# Patient Record
Sex: Female | Born: 1960 | Race: White | Hispanic: No
Health system: Midwestern US, Academic
[De-identification: ages and names within clinical notes are randomized; demographics above are authoritative.]

---

## 2016-08-26 ENCOUNTER — Encounter: Admit: 2016-08-26 | Discharge: 2016-08-26 | Payer: BC Managed Care – HMO

## 2016-08-26 ENCOUNTER — Ambulatory Visit: Admit: 2016-08-26 | Discharge: 2016-08-26 | Payer: BC Managed Care – HMO

## 2016-08-26 DIAGNOSIS — I10 Essential (primary) hypertension: ICD-10-CM

## 2016-08-26 DIAGNOSIS — R079 Chest pain, unspecified: Principal | ICD-10-CM

## 2016-08-26 DIAGNOSIS — E785 Hyperlipidemia, unspecified: ICD-10-CM

## 2016-08-26 DIAGNOSIS — E119 Type 2 diabetes mellitus without complications: ICD-10-CM

## 2016-08-26 MED ORDER — PERFLUTREN LIPID MICROSPHERES 1.1 MG/ML IV SUSP
1-20 mL | Freq: Once | INTRAVENOUS | 0 refills | Status: CP
Start: 2016-08-26 — End: ?
  Administered 2016-08-26: 20:00:00 5.5 mL via INTRAVENOUS

## 2016-08-26 NOTE — Progress Notes
JID reviewed pre stress ECGs.

## 2016-11-04 ENCOUNTER — Encounter: Admit: 2016-11-04 | Discharge: 2016-11-04 | Payer: BC Managed Care – HMO

## 2016-11-04 NOTE — Telephone Encounter
Service: General Hepatology  Referring:  Gardiner BarefootLatitia Guthals, NP  Urgency: Next available  Provider: Next available  Dx: Hepatic steatosis  OV note: in outside records.  Imaging/location: No imaging available  Pathology/location: None  Labs: In outside records.

## 2016-11-04 NOTE — Telephone Encounter
Received new referral, via fax. Docs scanned in 11/04/16.

## 2016-11-09 ENCOUNTER — Encounter: Admit: 2016-11-09 | Discharge: 2016-11-09 | Payer: BC Managed Care – HMO

## 2016-11-09 NOTE — Telephone Encounter
Call from [facility] regarding her referral and an appointment.  Please return her call.

## 2016-12-14 ENCOUNTER — Encounter: Admit: 2016-12-14 | Discharge: 2016-12-14 | Payer: BC Managed Care – HMO

## 2016-12-15 ENCOUNTER — Encounter: Admit: 2016-12-15 | Discharge: 2016-12-15 | Payer: BC Managed Care – HMO

## 2016-12-15 ENCOUNTER — Ambulatory Visit: Admit: 2016-12-15 | Discharge: 2016-12-15 | Payer: BC Managed Care – HMO

## 2016-12-15 DIAGNOSIS — K7581 Nonalcoholic steatohepatitis (NASH): Principal | ICD-10-CM

## 2016-12-15 DIAGNOSIS — E119 Type 2 diabetes mellitus without complications: ICD-10-CM

## 2016-12-15 DIAGNOSIS — R079 Chest pain, unspecified: Principal | ICD-10-CM

## 2016-12-15 DIAGNOSIS — K760 Fatty (change of) liver, not elsewhere classified: ICD-10-CM

## 2016-12-15 DIAGNOSIS — E785 Hyperlipidemia, unspecified: ICD-10-CM

## 2016-12-15 DIAGNOSIS — I10 Essential (primary) hypertension: ICD-10-CM

## 2016-12-15 LAB — HEPATITIS A IGG: Lab: NEGATIVE

## 2016-12-15 LAB — CBC AND DIFF
Lab: 4.9 M/UL (ref 4.0–5.0)
Lab: 7.6 K/UL (ref 4.5–11.0)

## 2016-12-15 LAB — COMPREHENSIVE METABOLIC PANEL
Lab: 1 mg/dL (ref 0.4–1.00)
Lab: 134 MMOL/L — ABNORMAL LOW (ref 137–147)
Lab: 26 U/L (ref 7–56)
Lab: 273 mg/dL — ABNORMAL HIGH (ref 70–100)
Lab: 3.3 MMOL/L — ABNORMAL LOW (ref 3.5–5.1)
Lab: 57 mL/min — ABNORMAL LOW (ref >60)
Lab: 7.9 g/dL (ref 6.0–8.0)

## 2016-12-15 LAB — HEPATITIS B SURFACE AB

## 2016-12-15 LAB — HEPATITIS C ANTIBODY W REFLEX HCV PCR QUANT: Lab: NEGATIVE

## 2016-12-15 LAB — PROTIME INR (PT): Lab: 1 mg/dL (ref 0.8–1.2)

## 2016-12-15 LAB — HEPATITIS B SURFACE AG: Lab: NEGATIVE

## 2016-12-16 LAB — ANTI-MITOCHONDRIAL ANTIBODY: Lab: <20 {titer} — ABNORMAL HIGH (ref <20)

## 2016-12-16 LAB — ANTI-SMOOTH MUSCLE AB

## 2016-12-16 LAB — LIVER FIBROSIS, FIBRO TEST
Lab: 0.1 % (ref 4–12)
Lab: 0.2 MMOL/L — ABNORMAL LOW (ref 7–11)
Lab: 212

## 2016-12-16 LAB — ANTI-NUCLEAR ANTIBODY(ANA): Lab: <80 {titer} (ref <80)

## 2016-12-16 LAB — 25-OH VITAMIN D (D2 + D3): Lab: 27 ng/mL — ABNORMAL LOW (ref 30–80)

## 2016-12-16 LAB — ANTI-SMOOTH MUSCLE-QUANT: Lab: 40 {titer} — ABNORMAL HIGH (ref <20)

## 2016-12-17 LAB — ALPHA 1 ANTITRYPSIN PHENOTYPE: Lab: 140 mL/min (ref >60)

## 2016-12-17 LAB — CERULOPLASMIN: Lab: 34 mg/dL (ref 20.0–60.0)

## 2016-12-18 NOTE — Progress Notes
Date of Service: 12/15/2016    Gabrielle Andrade is a 56 y.o. female.  DOB: October 26, 1960  MRN: [id number]     Subjective:           Chief complaint: Hepatic steatosis    History of Present Illness   56 year old female with class I obesity (BMI 30 kg/m2), hypertension, diabetes, fibromyalgia, irritable bowel syndrome, and gastroparesis who presents for evaluation for hepatic steatosis.  Gabrielle Andrade states that in August she developed hematuria (single episode; none further) which she was told was secondary to kidney stones.  She underwent an evaluation for her hematuria including an abdomen CT on October 28, 2016 which showed extensive hepatic steatosis, minimal splenic granulomatous disease, mild colonic diverticulosis, and atelectasis.  She subsequently had an ultrasound on October 29, 2016 which showed hepatic steatosis, hepatomegaly (liver measuring 18 cm), normal size spleen, calcified granuloma within the spleen, and common bile duct 5.7 mm.  She denies knowledge of any prior history of liver disease.  She denies any history of abnormal liver tests.  She has never had a liver biopsy.  She denies any history of IV drug use, intranasal drug use, high risk sexual behaviors, blood transfusions prior to 1992, or tattoos.  She has a sister and a nephew who also have nonalcoholic fatty liver disease.  She denies heavy alcohol use.  She denies any history of ascites, jaundice, hepatic encephalopathy, day night reversal, LE edema, or GI bleeding.  She has had significant pruritus for an unknown period of time.    She had laboratory studies performed on October 26, 2016 white count 11.6 hemoglobin 15.8 platelets 282 sodium 136 potassium 3.5 glucose 260 total bilirubin 0.7 AST 22 ALT 32 alkaline phosphatase 141 triglycerides 193 total cholesterol 182 HDL 42 LDL 112 A1c 8.9.    She has a long-standing history of chronic diarrhea and abdominal pain.  She will have up to 7 bowel movements daily.  She denies any blood in the stool.  She has had dark stools twice a week for years.  She has undergone multiple endoscopic exams.    Colonoscopy July 2010: Small external hemorrhoids, random biopsies of colon normal.    EGD July 2010 LA grade A esophagitis, minimal gastric erythema, biopsies obtained for H. pylori, normal small intestine.  Biopsies of the stomach: benign gastric mucosa with mild chronic inflammation no H. Pylori. Esophageal bx: chronic esophagitis.    Colonoscopy April 18, 2014 mild diverticulosis in the sigmoid, biopsies obtained of the terminal ileum and the cecum.  Terminal ileum biopsies benign mucosa with mild chronic inflammation. Cecal biopsies benign colonic mucosa with mild chronic inflammation    Gastric imaging study December 2015 delayed gastric emptying of 141 minutes (normal 45 minutes)    Past Medical History  Diabetes mellitus  Hypertension  Gastroesophageal reflux disease  Fibromyalgia  Cholecystectomy  Hyperlipidemia  Irritable bowel syndrome  Chronic back pain  Sinus infections  Class I obesity    Social History  Divorced.  She has a 7 year old daughter and 22 year old son.  She drinks 1 alcoholic beverage every few months.  She denies any history of tobacco or illicit drug use.  She is retired from [employer].  She is currently working part-time at [employer].    Family History  Father with coronary artery disease, status post CABG.  Son with hypertension.     Review of Systems  Appetite change.  Fatigue.  Unexpected weight change.  Sinus pressure.  Sore throat.  Eye discharge.  Eye itching.  Sleep disturbance.  Chest tightness.  Shortness of breath.  Chest pain.  Joint pain.  Back pain.  Headaches, dizziness, lightheadedness for years.  A complete 10 point review of systems negative other as listed in HPI.    Objective:         ??? albuterol (VENTOLIN HFA) 90 mcg/actuation inhaler Inhale 2 puffs by mouth into the lungs every 6 hours as needed for Wheezing or Shortness of Breath. Shake well before use. ??? atorvastatin (LIPITOR) 40 mg tablet Take 40 mg by mouth daily.   ??? calcium carbonate/vitamin D-3 (OSCAL-500+D) 1250 mg/200 unit tablet Take 1 tablet by mouth daily. Calcium Carb 1250mg  delivers 500mg  elemental Ca   ??? cyanocobalamin (VITAMIN B-12) 500 mcg tablet Take 500 mcg by mouth daily.   ??? dicyclomine (BENTYL) 10 mg capsule Take 10 mg by mouth four times daily.   ??? fluticasone (FLONASE) 50 mcg/actuation nasal spray Apply  to each nostril as directed daily. Shake bottle gently before using.   ??? loratadine (CLARITIN) 10 mg tablet Take 10 mg by mouth every morning.   ??? losartan-hydrochlorothiazide (HYZAAR) 100-25 mg tablet Take 1 tablet by mouth every morning.   ??? magnesium oxide (MAG-OX) 400 mg (241.3 mg magnesium) tablet Take 5,000 mg by mouth daily.   ??? metFORMIN (GLUCOPHAGE) 500 mg tablet Take 1,000 mg by mouth daily with breakfast.   ??? metoclopramide (REGLAN) 5 mg tablet Take 5 mg by mouth four times daily.   ??? montelukast (SINGULAIR) 10 mg tablet Take 10 mg by mouth at bedtime daily.   ??? RABEprazole DR (+) (ACIPHEX) 20 mg tablet Take 20 mg by mouth daily.   ??? topiramate (TOPAMAX) 100 mg tablet Take 100 mg by mouth at bedtime daily.   ??? zaleplon(+) (SONATA) 10 mg capsule Take 10 mg by mouth at bedtime daily.     Vitals:    12/15/16 1414   BP: 127/67   Pulse: 87   Resp: 16   Temp: 36.8 ???C (98.2 ???F)   TempSrc: Oral   SpO2: 98%   Weight: 82 kg (180 lb 12.8 oz)   Height: 165.1 cm (65)     Body mass index is 30.09 kg/m???.     Physical Exam  Constitutional: Patient appears well-developed, well-nourished, and in no distress.??? Responds appropriately to questions.???  Neuro:??? No tremor noted.??? Patient is AOx4.??? No asterixis.  HEENT:??? Head is normocephalic and atraumatic.??? No scleral icterus noted.??? Dentition is intact.  Neck and Lymph: Normal ROM. Neck supple. No thyromegaly.??? No lymphadenopathy.??? No JVD  Cardiac:??? S1 and S2, regular rate and rhythm.??? No murmurs Respiratory:??? lung sounds clear to auscultation bilaterally.??? No wheezes or crackles.  GI:??? Abdomen soft, obese, non-distended, non-tender. BS present.   Skin:??? Skin is warm, dry, and intact. No rash noted. Nails show no clubbing. ???  Peripheral Vascular: No lower extremity edema.  Musculoskeletal:??? ROM intact.???  Psychiatric: Normal mood and affect. Behavior is normal. Judgment and thought content normal.       Assessment and Plan:  56 year old female with class I obesity (BMI 30 kg/m2), hypertension, diabetes, fibromyalgia, irritable bowel syndrome, and gastroparesis who presents for evaluation for hepatic steatosis.    1. Hepatic steatosis  2. Diabetes  3. Hypertension  4. Class I obesity, BMI 30 kg/m2.  5. Hepatomegaly.  Ms. Ciliberto has multiple metabolic risk factors making NASH likely the cause of her hepatic steatosis and hepatomegaly. However, this is a diagnosis of exclusion, so we will perform serologies to evaluate for  other causes of chronic liver disease. She has no clinical, radiographic, or laboratory evidence of cirrhosis, but we will check a fibrosure.     I suspect she has nonalcoholic fatty liver disease???(NAFLD), so we discussed this in some detail. ???We discussed how fat deposits in the liver, how this leads to inflammation, and how chronic inflammation (NASH) can ultimately lead to scarring and cirrhosis. ???The long-term complications of fatty liver disease were discussed with the patient, including the???increased???risk of cardiovascular disease complications, and???variable progression to cirrhosis and end stage liver disease. ??????Regarding the management of fatty liver disease, I did discuss with the patient about an appropriate diet, exercise and weight loss???plan. ???The patient should exercise regularly 4-5 times per week, with an average of about 30-45 minutes???per day. ???She should attempt to lose 10%???of body weight in the next six months. ???It has been???shown that patients who exercise regularly can have improvement of insulin resistance and resolution of fatty liver disease, even if they are not able to lose weight. ???Diabetes management is crucial with ideal goal Hgb A1c???goal of =/<???7%. She is on metformin. Management of elevated cholesterol is also important in this population. ???The use of statins (HMG-CoA reductase medications) are an effective means of therapy and are not contraindicated in those with abnormal liver tests OR those with cirrhosis. ???The value of these medications in this population far outweigh the minor risks of abnormal liver tests. ???Goal LDL in those with NASH are <???100 mg/dL. She is currently on Lipitor.  Her blood pressure should be optimized and it is at goal today on Hyzaar. ???We discussed considering adding 1-2 cups of black coffee per day.   ???  We will check her for evidence of immunity to hepatitis A and B. If she is not immune, she should be vaccinated.   ???  6. At risk for colon cancer. She had a colonoscopy in 2016 which was negative for polyps.    7. Gastroparesis.  8. IBS.  She should follow with her local GI.  She is on Bentyl and Acihex.     She will return to clinic in 1 year.

## 2016-12-21 ENCOUNTER — Encounter: Admit: 2016-12-21 | Discharge: 2016-12-21 | Payer: BC Managed Care – HMO

## 2016-12-21 LAB — HEMOCHROMATOSIS HFE GENE ANAL

## 2017-01-11 ENCOUNTER — Encounter: Admit: 2017-01-11 | Discharge: 2017-01-11 | Payer: BC Managed Care – HMO

## 2017-01-11 DIAGNOSIS — R748 Abnormal levels of other serum enzymes: Principal | ICD-10-CM

## 2017-01-11 MED ORDER — ERGOCALCIFEROL (VITAMIN D2) 50,000 UNIT PO CAP
1 | ORAL_CAPSULE | ORAL | 0 refills | 56.00000 days | Status: AC
Start: 2017-01-11 — End: 2018-05-05

## 2017-01-11 NOTE — Telephone Encounter
Called patient--went over lab results with her including fibrosure results. Told her Vit D is low so sending in an Rx for high dose therapy. Went over instructions for taking it and told her will mail her the lab order to repeat the level once she's done with the Rx. Advised Dr. Adela Lank recommends that she get vaccinated for Hep A and B as she's not immune. Told her that her blood sugar is elevated, so needs to work with PCP on better control. Also told her that her calcium was a little elevated, so Dr. Adela Lank would like to check a couple of additional labs. Patient to see her PCP on Wednesday and would like me to fax the orders and her results there. Will mail her the Vit D lab order.    Rx sent for Vit D.  Lab orders entered.  Mailed Vit D order to the patient's home and faxed lab orders and last results to patient's PCP, [facility] at [phone]. Confirmation rec'd.    Reminder set to f/u.

## 2017-01-11 NOTE — Telephone Encounter
-----   Message from Maryan Char, MD sent at 12/24/2016 10:23 PM CDT -----  Please review labs with patient. Hb only mildly elevated. Glucose is elevated. Needs to work with PCP to manage diabetes. Her calcium is slightly elevated. Please check calcium again and ionized calcium now.  If persistently elevated, will need to send additional workup including PTH, UPEP, SPEP, serum free light chains, 25-vitamin D, 1,25 vitamin D.  ASMA only mildly elevated and of unclear significance. Minimal fibrosis on fibrosure which is reassuring. Vitamin D low. Recommend Vitamin D 16109 IU weekly x 12 weeks. Recommend hepatitis A and B vaccination.

## 2017-04-08 ENCOUNTER — Encounter: Admit: 2017-04-08 | Discharge: 2017-04-08 | Payer: BC Managed Care – HMO

## 2017-04-08 MED ORDER — VITAMIN D2 50,000 UNIT PO CAP
ORAL_CAPSULE | 0 refills
Start: 2017-04-08 — End: ?

## 2017-04-23 ENCOUNTER — Encounter: Admit: 2017-04-23 | Discharge: 2017-04-23 | Payer: BC Managed Care – HMO

## 2017-04-28 ENCOUNTER — Encounter: Admit: 2017-04-28 | Discharge: 2017-04-28 | Payer: BC Managed Care – HMO

## 2017-10-25 ENCOUNTER — Encounter: Admit: 2017-10-25 | Discharge: 2017-10-25 | Payer: BC Managed Care – HMO

## 2018-02-07 ENCOUNTER — Encounter: Admit: 2018-02-07 | Discharge: 2018-02-07 | Payer: BC Managed Care – HMO

## 2018-04-06 ENCOUNTER — Encounter: Admit: 2018-04-06 | Discharge: 2018-04-06 | Payer: BC Managed Care – HMO

## 2018-04-06 DIAGNOSIS — E559 Vitamin D deficiency, unspecified: Secondary | ICD-10-CM

## 2018-04-06 DIAGNOSIS — K760 Fatty (change of) liver, not elsewhere classified: Secondary | ICD-10-CM

## 2018-04-06 DIAGNOSIS — R748 Abnormal levels of other serum enzymes: Secondary | ICD-10-CM

## 2018-04-07 ENCOUNTER — Encounter: Admit: 2018-04-07 | Discharge: 2018-04-07 | Payer: BC Managed Care – HMO

## 2018-04-14 ENCOUNTER — Encounter: Admit: 2018-04-14 | Discharge: 2018-04-14 | Payer: BC Managed Care – HMO

## 2018-04-19 ENCOUNTER — Encounter: Admit: 2018-04-19 | Discharge: 2018-04-19 | Payer: BC Managed Care – HMO

## 2018-04-21 ENCOUNTER — Encounter: Admit: 2018-04-21 | Discharge: 2018-04-21 | Payer: BC Managed Care – HMO

## 2018-04-21 DIAGNOSIS — R748 Abnormal levels of other serum enzymes: Secondary | ICD-10-CM

## 2018-04-21 DIAGNOSIS — K760 Fatty (change of) liver, not elsewhere classified: Secondary | ICD-10-CM

## 2018-04-21 LAB — COMPREHENSIVE METABOLIC PANEL
Lab: 121 U/L (ref 7–25)
Lab: 136 mmol/L
Lab: 16 meq/L — ABNORMAL HIGH (ref 0–14)
Lab: 25 mmol/L
Lab: 3.6 mmol/L
Lab: 99 mmol/L

## 2018-04-21 LAB — CBC AND DIFF
Lab: 16 K/UL — ABNORMAL HIGH (ref 4.5–11.0)
Lab: 27 U/L (ref 98–110)
Lab: 5.7 mg/dL — ABNORMAL HIGH (ref 4.20–5.40)
Lab: 7.6 mg/dL — ABNORMAL HIGH (ref 9.8–20.1)

## 2018-04-25 ENCOUNTER — Encounter: Admit: 2018-04-25 | Discharge: 2018-04-25 | Payer: BC Managed Care – HMO

## 2018-04-28 ENCOUNTER — Encounter: Admit: 2018-04-28 | Discharge: 2018-04-28 | Payer: BC Managed Care – HMO

## 2018-04-28 NOTE — Progress Notes
Faxed request for lab report to [facility] [phone]

## 2018-04-29 ENCOUNTER — Encounter: Admit: 2018-04-29 | Discharge: 2018-04-29 | Payer: BC Managed Care – HMO

## 2018-04-29 DIAGNOSIS — K760 Fatty (change of) liver, not elsewhere classified: Principal | ICD-10-CM

## 2018-05-02 ENCOUNTER — Encounter: Admit: 2018-05-02 | Discharge: 2018-05-02 | Payer: BC Managed Care – HMO

## 2018-05-02 DIAGNOSIS — E559 Vitamin D deficiency, unspecified: ICD-10-CM

## 2018-05-02 DIAGNOSIS — K760 Fatty (change of) liver, not elsewhere classified: Principal | ICD-10-CM

## 2018-05-02 LAB — CBC AND DIFF
Lab: 0.1 MMOL/L (ref 21–30)
Lab: 0.3 U/L (ref 7–40)
Lab: 0.5 U/L — ABNORMAL HIGH (ref 25–110)
Lab: 1.1 g/dL (ref 6.0–8.0)
Lab: 1.8 g/dL — ABNORMAL LOW (ref 3.5–5.0)
Lab: 28 mg/dL (ref 7–25)
Lab: 3.7 mg/dL — ABNORMAL LOW (ref 0.3–1.2)
Lab: 4.3 mg/dL — ABNORMAL HIGH (ref 8.5–10.6)
Lab: 4.9 mg/dL (ref >60)
Lab: 6.3 mg/dL — ABNORMAL HIGH (ref 70–105)

## 2018-05-02 LAB — COMPREHENSIVE METABOLIC PANEL
Lab: 0.9 mg/dL (ref 21–30)
Lab: 12 meq/L (ref 25–110)
Lab: 13 mg/dL (ref 7–40)
Lab: 141 mmol/L — ABNORMAL LOW (ref >40)
Lab: 27 mmol/L — ABNORMAL LOW (ref 3–12)
Lab: 3.4 mmol/L — ABNORMAL LOW (ref <100)
Lab: 65 mL/Min/1.73 (ref 7–56)

## 2018-05-02 LAB — 25-OH VITAMIN D (D2 + D3): Lab: 34 ng/mL (ref 30–100)

## 2018-05-04 ENCOUNTER — Encounter: Admit: 2018-05-04 | Discharge: 2018-05-04 | Payer: BC Managed Care – HMO

## 2018-05-05 ENCOUNTER — Ambulatory Visit: Admit: 2018-05-05 | Discharge: 2018-05-06 | Payer: BC Managed Care – HMO

## 2018-05-05 ENCOUNTER — Encounter: Admit: 2018-05-05 | Discharge: 2018-05-05 | Payer: BC Managed Care – HMO

## 2018-05-05 DIAGNOSIS — E119 Type 2 diabetes mellitus without complications: ICD-10-CM

## 2018-05-05 DIAGNOSIS — R079 Chest pain, unspecified: Principal | ICD-10-CM

## 2018-05-05 DIAGNOSIS — I10 Essential (primary) hypertension: ICD-10-CM

## 2018-05-05 DIAGNOSIS — E785 Hyperlipidemia, unspecified: ICD-10-CM

## 2018-05-06 DIAGNOSIS — K760 Fatty (change of) liver, not elsewhere classified: Principal | ICD-10-CM

## 2018-05-27 NOTE — Progress Notes
She will have up to 7 bowel movements daily.  She denies any blood in the stool.  She has had dark stools twice a week for years.  She has undergone multiple endoscopic exams.    Colonoscopy July 2010: Small external hemorrhoids, random biopsies of colon normal.    EGD July 2010 LA grade A esophagitis, minimal gastric erythema, biopsies obtained for H. pylori, normal small intestine.  Biopsies of the stomach: benign gastric mucosa with mild chronic inflammation no H. Pylori. Esophageal bx: chronic esophagitis.    Colonoscopy April 18, 2014 mild diverticulosis in the sigmoid, biopsies obtained of the terminal ileum and the cecum.  Terminal ileum biopsies benign mucosa with mild chronic inflammation. Cecal biopsies benign colonic mucosa with mild chronic inflammation    Gastric imaging study December 2015 delayed gastric emptying of 141 minutes (normal 45 minutes)    Interval History  She denies any hospitalizations or emergency room visits since she was last seen.  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  She continues to suffer from chronic back pain.  She feels that she is retaining fluid in her ankles.  She has found it hard to exercise due to her work schedule.  Her weight is up by 6 pounds.  She complains of chronic fatigue.  She denies any jaundice, ascites, hepatic encephalopathy, GI bleeding, or pruritus.    Past Medical History  Diabetes mellitus  Hypertension  Gastroesophageal reflux disease  Fibromyalgia  Cholecystectomy  Hyperlipidemia  Irritable bowel syndrome  Chronic back pain  Sinus infections  Class I obesity    Social History  Divorced.  She has a 58 year old daughter and 84 year old son.  She drinks 1 alcoholic beverage every few months.  She denies any history of tobacco or illicit drug use.  She is retired from [employer].  She is currently working part-time at [employer].    Family History  Father with coronary artery disease, status post CABG.  Son with ??????Regarding the management of fatty liver disease, I did discuss with the patient about an appropriate diet, exercise and weight loss???plan. ???The patient should exercise regularly 4-5 times per week, with an average of about 30-45 minutes???per day. ???She should attempt to lose 10%???of body weight in the next six months. ???It has been???shown that patients who exercise regularly can have improvement of insulin resistance and resolution of fatty liver disease, even if they are not able to lose weight. ???Diabetes management is crucial with ideal goal Hgb A1c???goal of =/<???7%. She is on Actos and glipizide.  Management of elevated cholesterol is also important in this population. ???The use of statins (HMG-CoA reductase medications) are an effective means of therapy and are not contraindicated in those with abnormal liver tests OR those with cirrhosis. ???The value of these medications in this population far outweigh the minor risks of abnormal liver tests. ???Goal LDL in those with NASH are <???100 mg/dL. She is currently on Zetia. Her blood pressure should be optimized and it is at goal today on Hyzaar. ???We discussed considering adding 1-2 cups of black coffee per day.   ???    6. At risk for colon cancer. She had a colonoscopy in 2016 which was negative for polyps.    7. Gastroparesis.  8. IBS.  She should follow with her local GI.  She is on Bentyl and Aciphex.     She will return to clinic in 1 year.

## 2019-05-11 ENCOUNTER — Encounter: Admit: 2019-05-11 | Discharge: 2019-05-11 | Payer: BC Managed Care – HMO

## 2019-05-15 ENCOUNTER — Encounter: Admit: 2019-05-15 | Discharge: 2019-05-15 | Payer: BC Managed Care – HMO

## 2019-05-15 NOTE — Telephone Encounter
Patient requested lab and [name] orders be faxed to [facility] at [phone]. Requested call once completed.

## 2019-05-15 NOTE — Telephone Encounter
Faxed the following orders to [facility]:    1. 2/19 lab orders   2. US/ABD order     Fax# [phone] (Per patient's request)

## 2019-05-18 ENCOUNTER — Encounter: Admit: 2019-05-18 | Discharge: 2019-05-18 | Payer: BC Managed Care – HMO

## 2019-05-18 DIAGNOSIS — K760 Fatty (change of) liver, not elsewhere classified: Secondary | ICD-10-CM

## 2019-05-26 ENCOUNTER — Encounter: Admit: 2019-05-26 | Discharge: 2019-05-26 | Payer: BC Managed Care – HMO

## 2019-05-29 ENCOUNTER — Encounter: Admit: 2019-05-29 | Discharge: 2019-05-29 | Payer: BC Managed Care – HMO

## 2019-05-29 DIAGNOSIS — E559 Vitamin D deficiency, unspecified: Secondary | ICD-10-CM

## 2019-05-29 DIAGNOSIS — K760 Fatty (change of) liver, not elsewhere classified: Secondary | ICD-10-CM

## 2019-05-29 DIAGNOSIS — E119 Type 2 diabetes mellitus without complications: Secondary | ICD-10-CM

## 2019-05-29 LAB — PROTIME INR (PT): Lab: 11 s — ABNORMAL HIGH (ref 4.20–5.40)

## 2019-05-29 LAB — HEPATITIS A TOTAL AB (IGG+IGM)

## 2019-05-29 LAB — COMPREHENSIVE METABOLIC PANEL
Lab: 0.6 mg/dL
Lab: 0.9 mg/dL
Lab: 10 mg/dL
Lab: 103 mmol/L
Lab: 116 U/L
Lab: 141 mmol/L — ABNORMAL HIGH (ref 37.0–47.0)
Lab: 16 meq/L — ABNORMAL HIGH (ref 0–14)
Lab: 17
Lab: 178 mg/dL — ABNORMAL HIGH (ref 70–105)
Lab: 25 U/L
Lab: 26 mmol/L
Lab: 31 U/L
Lab: 4.7 g/dL
Lab: 65
Lab: 7.8 g/dL

## 2019-05-29 LAB — HEMOGLOBIN A1C: Lab: 7.7 — ABNORMAL HIGH (ref <5.7)

## 2019-05-29 LAB — 25-OH VITAMIN D (D2 + D3): Lab: 35 mmol/L

## 2019-05-29 LAB — CBC AND DIFF: Lab: 7

## 2019-05-29 NOTE — Progress Notes
Fax Request sent to [facility] for patient labs.   FAX: [phone]

## 2019-05-31 ENCOUNTER — Encounter: Admit: 2019-05-31 | Discharge: 2019-05-31 | Payer: BC Managed Care – HMO

## 2019-05-31 NOTE — Telephone Encounter
Spoke to patient. Has MyChart and Zoom downloaded. Patient understands to test Zoom video and audio on chosen device. Pt has pop up blockers disabled on their device. Pt. was able to log into MyChart to verify they have access. Patient instructed to read the Telehealth consents in MyChart.  Notified patient that the clinic staff will call 30min-1hr prior to visit. Pt. Reports that they are able to get vitals prior to the visit.  Pt. had no questions at this time.

## 2019-06-01 ENCOUNTER — Encounter: Admit: 2019-06-01 | Discharge: 2019-06-01 | Payer: BC Managed Care – HMO

## 2019-06-01 ENCOUNTER — Ambulatory Visit: Admit: 2019-06-01 | Discharge: 2019-06-02 | Payer: BC Managed Care – HMO

## 2019-06-30 ENCOUNTER — Encounter: Admit: 2019-06-30 | Discharge: 2019-06-30 | Payer: BC Managed Care – HMO

## 2019-07-27 ENCOUNTER — Encounter: Admit: 2019-07-27 | Discharge: 2019-07-27 | Payer: BC Managed Care – HMO

## 2019-07-27 NOTE — Telephone Encounter
Requested call regarding getting lab results.

## 2019-07-31 ENCOUNTER — Encounter: Admit: 2019-07-31 | Discharge: 2019-07-31 | Payer: BC Managed Care – HMO

## 2019-08-11 ENCOUNTER — Encounter: Admit: 2019-08-11 | Discharge: 2019-08-11 | Payer: BC Managed Care – HMO

## 2019-08-16 ENCOUNTER — Encounter: Admit: 2019-08-16 | Discharge: 2019-08-16 | Payer: BC Managed Care – HMO

## 2019-08-22 ENCOUNTER — Encounter: Admit: 2019-08-22 | Discharge: 2019-08-22 | Payer: BC Managed Care – HMO

## 2019-09-11 ENCOUNTER — Encounter: Admit: 2019-09-11 | Discharge: 2019-09-11 | Payer: BC Managed Care – HMO

## 2019-09-11 DIAGNOSIS — K760 Fatty (change of) liver, not elsewhere classified: Secondary | ICD-10-CM

## 2019-09-11 DIAGNOSIS — R600 Localized edema: Secondary | ICD-10-CM

## 2019-09-13 ENCOUNTER — Encounter: Admit: 2019-09-13 | Discharge: 2019-09-13 | Payer: BC Managed Care – HMO

## 2019-09-14 ENCOUNTER — Encounter: Admit: 2019-09-14 | Discharge: 2019-09-14 | Payer: BC Managed Care – HMO

## 2019-09-14 DIAGNOSIS — K760 Fatty (change of) liver, not elsewhere classified: Secondary | ICD-10-CM

## 2019-09-14 LAB — HEPATITIS B SURFACE AB

## 2019-11-17 ENCOUNTER — Encounter: Admit: 2019-11-17 | Discharge: 2019-11-17 | Payer: BC Managed Care – HMO

## 2020-02-05 ENCOUNTER — Encounter: Admit: 2020-02-05 | Discharge: 2020-02-05 | Payer: BC Managed Care – HMO

## 2020-02-05 NOTE — Telephone Encounter
Gabrielle Andrade returned a missed call.  Requested a call back regarding appointment.

## 2020-02-05 NOTE — Telephone Encounter
Scheduled from Cherie (06/18/20 11:30) to JW 06/18/20 2:30PM

## 2020-02-12 ENCOUNTER — Encounter: Admit: 2020-02-12 | Discharge: 2020-02-12 | Payer: BC Managed Care – HMO

## 2020-05-31 ENCOUNTER — Encounter: Admit: 2020-05-31 | Discharge: 2020-05-31 | Payer: BC Managed Care – HMO

## 2020-05-31 NOTE — Telephone Encounter
RN returned call to patient. Lab orders faxed to provided fax number. Patient was notified.

## 2020-05-31 NOTE — Telephone Encounter
Pt called to remind NC that she is needing labs orders to be faxed to [facility] at F: [phone] so that she will labs results ready for her appt on 03/28 at 400pm. Please give her a call once lab orders have been faxed.

## 2020-06-10 ENCOUNTER — Encounter: Admit: 2020-06-10 | Discharge: 2020-06-10 | Payer: BC Managed Care – HMO

## 2020-06-10 DIAGNOSIS — K760 Fatty (change of) liver, not elsewhere classified: Secondary | ICD-10-CM

## 2020-06-10 DIAGNOSIS — R748 Abnormal levels of other serum enzymes: Secondary | ICD-10-CM

## 2020-06-10 LAB — LIPID PROFILE
Lab: 155 — ABNORMAL HIGH (ref <150)
Lab: 31 — ABNORMAL HIGH (ref 0–14)
Lab: 44 — ABNORMAL HIGH (ref 9.8–20.1)
Lab: 5 mg/dL

## 2020-06-10 LAB — COMPREHENSIVE METABOLIC PANEL
Lab: 0.4 mg/dL
Lab: 118
Lab: 137 mmol/l — ABNORMAL HIGH (ref 4.20–5.40)
Lab: 19
Lab: 231 — ABNORMAL HIGH (ref 70–105)
Lab: 33
Lab: 4.1
Lab: 54 — ABNORMAL LOW (ref >59)
Lab: 7.3
Lab: 9.9

## 2020-06-10 LAB — CBC AND DIFF
Lab: 0.1
Lab: 6

## 2020-06-10 LAB — PROTIME INR (PT)
Lab: 1 — ABNORMAL HIGH (ref <100)
Lab: 10 s — ABNORMAL HIGH (ref <200)

## 2020-06-13 ENCOUNTER — Encounter: Admit: 2020-06-13 | Discharge: 2020-06-13 | Payer: BC Managed Care – HMO

## 2020-06-13 DIAGNOSIS — K760 Fatty (change of) liver, not elsewhere classified: Secondary | ICD-10-CM

## 2020-06-13 DIAGNOSIS — R748 Abnormal levels of other serum enzymes: Secondary | ICD-10-CM

## 2020-06-13 LAB — 25-OH VITAMIN D (D2 + D3): Lab: 34 ng/mL (ref 30–100)

## 2020-06-17 ENCOUNTER — Encounter: Admit: 2020-06-17 | Discharge: 2020-06-17 | Payer: BC Managed Care – HMO

## 2020-06-17 ENCOUNTER — Ambulatory Visit: Admit: 2020-06-17 | Discharge: 2020-06-17 | Payer: BC Managed Care – HMO

## 2020-06-17 DIAGNOSIS — R079 Chest pain, unspecified: Secondary | ICD-10-CM

## 2020-06-17 DIAGNOSIS — I10 Essential (primary) hypertension: Secondary | ICD-10-CM

## 2020-06-17 DIAGNOSIS — E7849 Other hyperlipidemia: Secondary | ICD-10-CM

## 2020-06-17 DIAGNOSIS — K7581 Nonalcoholic steatohepatitis (NASH): Secondary | ICD-10-CM

## 2020-06-17 DIAGNOSIS — E559 Vitamin D deficiency, unspecified: Secondary | ICD-10-CM

## 2020-06-17 DIAGNOSIS — E119 Type 2 diabetes mellitus without complications: Secondary | ICD-10-CM

## 2020-06-17 DIAGNOSIS — R7302 Impaired glucose tolerance (oral): Secondary | ICD-10-CM

## 2020-06-17 DIAGNOSIS — E785 Hyperlipidemia, unspecified: Secondary | ICD-10-CM

## 2020-06-17 DIAGNOSIS — Z713 Dietary counseling and surveillance: Secondary | ICD-10-CM

## 2020-06-17 NOTE — Progress Notes
Date of Service: 06/17/2020     Chief complaint/Reason for visit:  Routine hepatology visit   Native Liver Disease:   Primary Hepatologist: Dr. Jan Fireman (previously Dr. Adela Lank)  Last clinic visit: 06/01/2019 with Dr. Adela Lank  PCP: Rockwell [place], [facility]    MyChart for communication: Yes    Subjective:          Gabrielle Andrade is a 60 y.o. 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  ?  History of Present Illness  Gabrielle Andrade is a 60 y.o. female presenting to the hepatology clinic alone today for continued management of metabolic dysfunction associated fatty liver disease.    Please refer to additional hepatology documentation noted on 06/01/2019 for additional pertinent history    We discussed how fat deposits in the liver, how this leads to inflammation, and how chronic inflammation can ultimately lead to scarring and cirrhosis. The long-term complications of fatty liver disease were discussed with the patient, including variable progression to cirrhosis and end stage liver disease. We extensively reviewed the progression of liver fibrosis from F0/F1 to F4 (cirrhosis).       Discussed and reviewed potential plan(s) of care: lifestyle modifications, use of pharmacotherapy (GLP-1 agonist, SGLT2 inhibitor), and metabolic risk factor control.      We reviewed the role of uncontrolled metabolic risk factors and the negative impact of these disease process with a concurrent diagnosis of NAFLD (i.e. hypertension, hyperlipidemia, diabetes, and untreated obstructive sleep apnea).     We additionally discussed the implementation of lifestyle modifications including Mediterranean style diet that is moderately low in carbohydrates and consists of lean proteins (chicken, fish, [place]).     We reviewed the recommendation to reduce the amounts of red meat consumption as it has shown higher rates of organ fat deposition.  Mediterranean dietary handout provided today in patient AVS.     She denies any overt signs of GI bleeding at this time such as hematemesis, coffee-ground emesis, melena, or hematochezia.    She does endorse that she has had some difficulties in controlling her fasting blood sugars.  She reports she generally runs from the 1 60-2 60 range.  Discussed today potential referral for endocrinology with her reported most recent A1c of 8.4.  She updates that she is seeing her primary care provider in the coming weeks and will inquire about a referral.  Advised her to additionally follow-up with our office should she need an additional assistance with a referral.    Noted she is hypertensive today presenting greater than 160 systolic.  Recheck noted to be 148/84.  She reports that previously in October her blood pressure was well controlled at 110/52.  Advised that she continue to follow closely with her primary care provider for ongoing management.    Tobacco Use: None  Alcohol Use: rare, once-twice per year  Substance Use: None    Hospitalizations since last OV: COVID + pneumonia (Aug 2021)    Past Medical History:  Medical History:   Diagnosis Date   ? Chest pain    ? Diabetes (HCC)    ? HTN (hypertension)    ? Hyperlipemia      No past surgical history on file.    Social History     Socioeconomic History   ? Marital status: Divorced     Spouse name: Not on file   ?  Number of children: Not on file   ? Years of education: Not on file   ? Highest education level: Not on file   Occupational History   ? Not on file   Tobacco Use   ? Smoking status: Never Smoker   ? Smokeless tobacco: Never Used   Substance and Sexual Activity   ? Alcohol use: Not on file   ? Drug use: Not on file   ? Sexual activity: Not on file   Other Topics Concern   ? Not on file   Social History Narrative   ? Not on file     No family history on file.    Review of Systems  A complete 10 point review of systems was negative except those listed in the HPI.    Reviewed and updated active medication and allergy list.     Objective:         ? albuterol (VENTOLIN HFA) 90 mcg/actuation inhaler Inhale 2 puffs by mouth into the lungs every 6 hours as needed for Wheezing or Shortness of Breath. Shake well before use.   ? ascorbic acid (VITAMIN C PO) Take 1,000 mg by mouth four times weekly. emergency vitamin C drink   ? calcium carbonate/vitamin D-3 (OSCAL-500+D) 1250 mg/200 unit tablet Take 1 tablet by mouth daily. Calcium Carb 1250mg  delivers 500mg  elemental Ca   ? canagliflozin (INVOKANA) 300 mg tablet Take 300 mg by mouth daily with breakfast.   ? Cinnamon Bark (CINNAMON) 500 mg cap Take  by mouth.   ? CoQ10 (Ubiquinol) 200 mg cap Take 200 mg by mouth four times weekly.   ? ergocalciferol (vitamin D2) (VITAMIN D PO) Take  by mouth.   ? ezetimibe (ZETIA) 10 mg tablet Take 10 mg by mouth daily.   ? famotidine (PEPCID) 20 mg tablet Take 20 mg by mouth daily.   ? Lactobac. rhamnosus GG-inulin (CULTURELLE DIGESTIVE HEALTH) 10 billion cell -200 mg cap Take  by mouth.   ? levocetirizine 5 mg tab Take 5 mg by mouth at bedtime daily.   ? losartan-hydrochlorothiazide (HYZAAR) 100-25 mg tablet Take 1 tablet by mouth every morning.   ? magnesium oxide (MAG-OX) 400 mg (241.3 mg magnesium) tablet Take 500 mg by mouth daily.   ? montelukast (SINGULAIR) 10 mg tablet Take 10 mg by mouth at bedtime daily.   ? pantoprazole DR (PROTONIX) 40 mg tablet Take 40 mg by mouth daily.   ? potassium chloride SR (K-DUR) 10 mEq tablet Take 10 mEq by mouth daily. Take with a meal and a full glass of water.   ? topiramate (TOPAMAX) 100 mg tablet Take 100 mg by mouth at bedtime daily.   ? vitamin E acetate (VITAMIN E PO) Take 180 mg by mouth four times weekly.     Vitals:    06/17/20 1606 06/17/20 1656   BP: (!) 162/76 (!) 148/84   BP Source: Arm, Right Upper    Patient Position: Sitting    Pulse: 89 75   SpO2: 99%    Weight: 80.3 kg (177 lb)    Height: 165.1 cm (5' 5)    PainSc: Zero      Body mass index is 29.45 kg/m?Marland Kitchen      Physical Exam  Vitals and nursing note reviewed.   Constitutional:       Appearance: Normal appearance.   HENT:      Head: Normocephalic and atraumatic.      Mouth/Throat:      Comments: Mask  in place  Eyes:      General: No scleral icterus.     Conjunctiva/sclera: Conjunctivae normal.   Cardiovascular:      Rate and Rhythm: Normal rate and regular rhythm.      Pulses: Normal pulses.      Heart sounds: Normal heart sounds.   Pulmonary:      Effort: Pulmonary effort is normal.      Breath sounds: Normal breath sounds.   Abdominal:      General: Bowel sounds are normal. There is no distension.      Palpations: Abdomen is soft.      Tenderness: There is no abdominal tenderness. There is no guarding or rebound.   Musculoskeletal:         General: No swelling.      Cervical back: Normal range of motion and neck supple.      Right lower leg: No edema.      Left lower leg: No edema.   Skin:     General: Skin is warm and dry.      Coloration: Skin is not jaundiced.   Neurological:      General: No focal deficit present.      Mental Status: She is alert and oriented to person, place, and time. Mental status is at baseline.   Psychiatric:         Mood and Affect: Mood normal.         Behavior: Behavior normal.         Thought Content: Thought content normal.         Judgment: Judgment normal.       MELD-Na score: 7 at 06/10/2020  7:52 AM  MELD score: 7 at 06/10/2020  7:52 AM  Calculated from:  Serum Creatinine: 1.10 mg/dL at 1/61/0960  4:54 AM  Serum Sodium: 137 mmol/l at 06/10/2020  7:52 AM  Total Bilirubin: 0.49 mg/dL (Using min of 1 mg/dL) at 0/98/1191  4:78 AM  INR(ratio): 1.0 at 06/10/2020  7:52 AM  Age: 9 years    Assessment/Plan/Recommendations    Metabolic Dysfunction Associated Fatty Liver Disease/NASH:   - Regarding the management of fatty liver disease, discussed an appropriate diet, exercise and weight loss plan.  The patient should exercise regularly 3-4 times per week, with an average of 30-45 minutes per day. The patient should be on a moderately low carbohydrate (i.e. Mediterranean style diet), low-calorie diet (1500-1700 calories per day).   - It is recommended to lose 5% body weight in order to reduce steatosis; and 10% is needed to reduce inflammation associated with NASH. Weight loss may be achieved through diet alone, or ideally, in conjunction with exercise.   Wt Readings from Last 6 Encounters:   06/17/20 80.3 kg (177 lb)   05/05/18 84.6 kg (186 lb 6.4 oz)   12/15/16 82 kg (180 lb 12.8 oz)   08/26/16 82.1 kg (181 lb)   08/26/16 81.6 kg (180 lb)     Body mass index is 29.45 kg/m?.   -  It has shown that patients who exercise regularly can have improvement of insulin resistance and resolution of fatty liver disease, even if they are not able to lose weight. Discussed use of GLP-1 agonist and SGLT2 inhibitor for additional management of fatty liver disease. Discussed role of GLP-1 agonist which will help with glucose control, weight loss and has also been shown to help reverse fibrotic changes in the liver.  - Consider the utility of liberalizing coffee and black  tea consumption (1-2 cups daily) as some data supports this may slow progression and reverse effects of NASH-related fibrosis. Additional reduction in red meat consumption as it has shown to have higher rates of organ fat deposition was discussed.  - The highest mortality in NAFLD/NASH is from cardiovascular disease and careful attention should be paid to those risk factors, including management of HTN, HLD, DM2.     Lipid Management/Screening:     - Management of elevated cholesterol is also important in this population.  The use of statins (HMG-CoA reductase medications) are an effective means of therapy and are not contra-indicated in those with abnormal liver tests OR those with cirrhosis.  The value of these medications in this population far outweigh the minor risks of abnormal liver tests.     - Transient asymptomatic liver enzyme elevation up to 3 times upper normal limit is common with a statin treatment.   - Goal LDL in those with NALFD/NASH are < 100 mg/dL or 70mg /dL in individuals with known diabetes.   -Follows with her primary care provider for ongoing management.    Dietary Counseling:   - Discussed above noted recommendations and encouraged continued lifestyle modifications.     Obesity (BMI 30-39.9):   - Class I (BMI ?30 to 35), class II (BMI ?35 to 40), and class III (BMI ?40).     Impaired Glucose Tolerance OR Diabetes Management/Screening:    - Reviewed A1C values: Normal below 5.7%, prediabetes 5.7-6.4%, >6.5% correlates to a diagnosis of diabetes.   - Diabetes management is crucial with ideal goal Hgb A1c goal of =/< 7%.    - Last Hemoglobin A1C: 8.4 (reported value from recent outside labs)    Bone health:   - Strongly recommend screening for Vitamin D deficiency at least twice yearly with aggressive supplementation/replacement as indicated. Replacement per CFT protocol for values less than 30ng/mL.  Lab Results   Component Value Date/Time    VITD25 34 06/10/2020 07:52 AM   - All patients with liver disease, particularly those with cholestatic liver disease, are at an increased risk for osteopenia/osteoporosis.   - Recommend calcium +Vitamin D supplementation (2000 IU of Vitamin D and 1200mg  Calcium)..      Vaccinations:  Immunization History   Administered Date(s) Administered   ? COVID-19 (MODERNA), mRNA vacc, 100 mcg/0.5 mL (PF) 02/23/2020, 03/29/2020    Hepatitis A/B (Strongly recommend vaccinations for hepatitis A and B if the patient not immune.)  -Reviewed titers.  Noted to have dual immunity.    Health Maintenance & Cancer Screening:   - Recommend you stay up to date for cancer screening: mammograms/PAP for women and prostate cancer screening for men, and colon cancer screening for all.  Health Maintenance   Topic Date Due   ? HIV SCREENING  Never done   ? DTAP/TDAP VACCINES (1 - Tdap) Never done   ? PHYSICAL (COMPREHENSIVE) EXAM  Never done   ? CERVICAL CANCER SCREENING  Never done   ? BREAST CANCER SCREENING  Never done   ? SHINGLES RECOMBINANT VACCINE (1 of 2) Never done   ? COLORECTAL CANCER SCREENING  Never done   ? INFLUENZA VACCINE  Never done   ? COVID-19 VACCINE (3 - Booster for Moderna series) 08/27/2020   ? HEPATITIS C SCREENING  Completed      Routine Health Care in Patient with Chronic Liver Disease:  - All patients with liver disease should avoid the use of Non-steroidal Anti-Inflammatory (NSAID) medications as they can cause  significant injury to the kidneys in this population.  - Recommended the patient avoid herbal supplements and over the counter herbal remedies due to potential hepatic toxicities.      COVID 19 Precautions:   - With underlying liver disease you are categorized as high risk. --> We encourage that when available, you proceed with COVID 19 vaccination.    - If you are diagnosed with COVID 19, please notify our office immediately to determine if you are a possible candidate to receive mAB infusion therapy.     For up to date information on the COVID-19 virus, visit the [facility] website. [email or web address]  ? General supportive care during cold and flu season and infection prevention reminders:   ? Wash hands often with soap and water for at least 20 seconds  ? Cover your mouth and nose  ? Social distancing: try to maintain 6 feet between you and other people  ? Stay home if sick and symptoms mild or manageable  ? If you must be around people wear a mask     ? If you are having symptoms of a lower respiratory infection (cough, shortness of breath) and/or fever AND either traveled in last 30 days (internationally or to region of exposure) OR known exposure to patient with COVID19:     ? Call your primary care provider for questions or health needs.   ? Tell your doctor about your recent travel and your symptoms     ? In a medical emergency, call 911 or go to the nearest emergency room.    Follow Up:   12 months in clinic or sooner if needed. The patient is to call our office with any questions  or changes to your health condition.     Labs: MELD (CBC +Diff, CMP, INR), Vit D, Lipid Panel     Imaging: N/A      Procedures/Biopsy: N/A     Referrals: N/A    Other: N/A      A total of 45 minutes was spent in the following activities: Preparing to see the patient, Obtaining and/or reviewing separately obtained history, Performing a medically appropriate examination and/or evaluation, Counseling and educating the patient/family/caregiver, Ordering medications, tests, or procedures, Documenting clinical information in the electronic or other health record and Independently interpreting results (not separately reported) and communicating results to the patient/family/caregiver.    Patient/patient family reports that all questions have been answered at this time.  No additional pending concerns.    Thank you very much for the opportunity to participate in the care of this patient.  If you have any further questions, please don't hesitate to contact our office. This note was created using Dragon, a Chemical engineer.  Please contact my office for any clarification of documentation.    _____________________________  Marcelyn Bruins, APRN-C  Hepatology & Liver Transplantation  [phone]  [phone]  Fax: [phone]

## 2020-07-03 ENCOUNTER — Encounter: Admit: 2020-07-03 | Discharge: 2020-07-03 | Payer: BC Managed Care – HMO

## 2020-07-03 NOTE — Telephone Encounter
Pt called and requested that she get phones calls and to not messages through my chart. She states she does not check it often.    Pt called wanting to schedule an in-office visit for her one year follow up appt. Please give her a call.    Pt also following up on status of medications she was told would help with diabetic liver. She was under the impression that she would get a call in regards to it. Please give her a call.

## 2020-07-11 ENCOUNTER — Encounter: Admit: 2020-07-11 | Discharge: 2020-07-11 | Payer: BC Managed Care – HMO

## 2020-07-11 DIAGNOSIS — E119 Type 2 diabetes mellitus without complications: Secondary | ICD-10-CM

## 2020-07-11 DIAGNOSIS — K760 Fatty (change of) liver, not elsewhere classified: Secondary | ICD-10-CM

## 2020-07-11 MED ORDER — OZEMPIC 0.25 MG OR 0.5 MG(2 MG/1.5 ML) SC PNIJ
0 refills | Status: AC
Start: 2020-07-11 — End: ?

## 2020-07-11 NOTE — Progress Notes
Order for GLP-1 Agonist (Ozempic) placed and submitted to pharmacy. PA completed via CoverMyMeds and is awaiting determination.

## 2020-07-23 ENCOUNTER — Encounter: Admit: 2020-07-23 | Discharge: 2020-07-23 | Payer: BC Managed Care – HMO

## 2020-07-23 NOTE — Telephone Encounter
RN returned call.     Will try another ALP-1 Agonist medication to try for approval. Will keep patient updated.

## 2020-07-23 NOTE — Telephone Encounter
Pt LVM regarding a medication cost is $800. Please call her back.

## 2020-12-03 ENCOUNTER — Encounter: Admit: 2020-12-03 | Discharge: 2020-12-03 | Payer: BC Managed Care – HMO

## 2020-12-03 NOTE — Telephone Encounter
Pt called Gabrielle Millet has or was working on getting a PA on a Rx and the pharmacy is waiting on [name]. Please call her back.

## 2021-06-19 ENCOUNTER — Encounter: Admit: 2021-06-19 | Discharge: 2021-06-19 | Payer: BC Managed Care – HMO

## 2021-06-19 NOTE — Telephone Encounter
Returned call to patient. Usually ha labs done before she comes. Labs done in December with PCP, was wondering if those labs would work.     Will request results from PCP office, if they do not have the labs we need then we can have drawn prior to visit locally.

## 2021-06-19 NOTE — Telephone Encounter
Received an incoming call from:    Caller Name: Ishita    Relationship to Patient: Self    Callback Number: [phone]    Purpose of Call: Please call patient she have questions about getting additional labs

## 2021-06-20 ENCOUNTER — Encounter: Admit: 2021-06-20 | Discharge: 2021-06-20 | Payer: BC Managed Care – HMO

## 2021-06-20 DIAGNOSIS — K760 Fatty (change of) liver, not elsewhere classified: Secondary | ICD-10-CM

## 2021-06-20 DIAGNOSIS — E119 Type 2 diabetes mellitus without complications: Secondary | ICD-10-CM

## 2021-06-20 DIAGNOSIS — R748 Abnormal levels of other serum enzymes: Secondary | ICD-10-CM

## 2021-06-21 ENCOUNTER — Encounter: Admit: 2021-06-21 | Discharge: 2021-06-21 | Payer: BC Managed Care – HMO

## 2021-06-21 DIAGNOSIS — K760 Fatty (change of) liver, not elsewhere classified: Secondary | ICD-10-CM

## 2021-06-21 DIAGNOSIS — R748 Abnormal levels of other serum enzymes: Secondary | ICD-10-CM

## 2021-06-21 DIAGNOSIS — E119 Type 2 diabetes mellitus without complications: Secondary | ICD-10-CM

## 2021-06-30 ENCOUNTER — Ambulatory Visit: Admit: 2021-06-30 | Discharge: 2021-07-01 | Payer: BC Managed Care – HMO

## 2021-06-30 ENCOUNTER — Encounter: Admit: 2021-06-30 | Discharge: 2021-06-30 | Payer: BC Managed Care – HMO

## 2021-06-30 VITALS — BP 137/81 | HR 95 | Temp 97.80000°F | Ht 60.0 in | Wt 177.8 lb

## 2021-06-30 DIAGNOSIS — E119 Type 2 diabetes mellitus without complications: Secondary | ICD-10-CM

## 2021-06-30 DIAGNOSIS — K7581 Nonalcoholic steatohepatitis (NASH): Principal | ICD-10-CM

## 2021-06-30 NOTE — Patient Instructions
Patient Information:    1.  Diet: Heart Healthy, Mediterranean.   Healthy proteins- bean, legumes, plant based, chicken or pork.  Full fat dairy as low fat versions add a lot of sugars.  Cook- high heat use avocado oil and as a dressing use olive oil.  Sodium goal 000mg  per day.    2. Fatty Liver (Metabolic Liver Disease) treatment is making a lifestyle change.  Start incorporating more vegetables into meals, lean protein and increase your water intake. Increasing purposeful exercise to at least 60 minutes cardio a day 5 days a week- start slow and work your way up.    3. Start watching the amount of sodium/ salt in your diet.  Your goal is no more than 2,000mg  per day.  Watch serving sizes and read labels carefully to include what you eat and drink.    4. Patient will get labs through her PCP with next lab draw     5. We will plan for a fibroscan in 1 year at next office visit.  You will need to fast before this OV    6. Trulicity, Ozempic, Monjero, please speak with your PCP see if these medications are an option for you.                     General Instructions:  How to reach [name]:   Please send a MyChart message to the General Hepatology clinic or call [phone], option 2.  How to get a medication refill:  Please use the MyChart Refill request or contact your pharmacy directly to request medication refills. Please allow 48 hours.     This clinic does not prescribe pain medications.  If you have had labs or imaging outside the [place] system and have not received results, call us and let us know where they were completed.   If you have completed labs or imaging today and have not received results within 10 days, please contact our office    [facility]  Dr. Mitchell Heir RN BSN   [phone]

## 2021-06-30 NOTE — Progress Notes
Hepatology Clinic Note    ATTESTATION    I personally performed the key portions of the E/M visit, discussed case with the Resident/Fellow/Nurse Practitioner and concur with documentation of history, physical exam, assessment, and treatment plan unless otherwise noted.    History of steatosis without fibrosis  Struggles with weight reduction and diabetes control  Insurance did not cover Ozempic or Trulicity.  Started on Januvia    Impression/Assessment/Plan/Recommendations:    1. NASH (nonalcoholic steatohepatitis)  FIBROSCAN      2. Impaired glucose tolerance  FIBROSCAN      3. Type 2 diabetes mellitus without complication, unspecified whether long term insulin use (HCC)  FIBROSCAN         ? Continue with lifestyle changes to reduce at least 10% of her weight  ? Repeat LFTs + A1c + lipid profile upon return with PCP in 08/2021   ? Future FibroScan    Return in about 1 year (around 07/01/2022) for In-Person, Marcelyn Bruins, General Hepatolgoy, Fibroscan.     Jan Fireman, MD  Gastroenterology and Transplant Hepatology  06/30/2021     Total Time Today was 30 minutes in the following activities: Preparing to see the patient, Obtaining and/or reviewing separately obtained history, Performing a medically appropriate examination and/or evaluation, Counseling and educating the patient/family/caregiver, Ordering medications, tests, or procedures, Documenting clinical information in the electronic or other health record, and Independently interpreting results (not separately reported) and communicating results to the patient/family/caregiver  ___________________________________________________________________________________________________________________________________________    Date of Service: 06/30/2021    Gabrielle Andrade is a 61 y.o. female.    Subjective:             History of Present Illness  Gabrielle Andrade is a pleasant 61 yo F with HTN, DM, hyperlipidemia, who presenting for follow up for NAFLD.    Patient was last seen in 05/2020 and overall has been doing well. Her most recent fibroscan was 04/2018 showing F0 fibrosis with stiffness of 4.5 kPa.     Her only update is that she is now on [place] for the last 3 months. She has stayed at about 177 lbs. She was unable to obtain trulicity or ozempic after her last appointment due to insurance issues. She is following with her PCP for diabetes management. She continues to have high fasting glucose levels at home.     She denies any abdominal swelling, edema, bleeding, confusion.     Review of Systems  10 point ROS obtained, pertinent per HPI, otherwise negative    Objective:         ? albuterol (VENTOLIN HFA) 90 mcg/actuation inhaler Inhale 2 puffs by mouth into the lungs every 6 hours as needed for Wheezing or Shortness of Breath. Shake well before use.   ? ascorbic acid (VITAMIN C PO) Take 1,000 mg by mouth four times weekly. emergency vitamin C drink   ? calcium carbonate/vitamin D-3 (OSCAL-500+D) 1250 mg/200 unit tablet Take 1 tablet by mouth daily. Calcium Carb 1250mg  delivers 500mg  elemental Ca   ? canagliflozin (INVOKANA) 300 mg tablet Take 300 mg by mouth daily with breakfast.   ? Cinnamon Bark (CINNAMON) 500 mg cap Take  by mouth.   ? CoQ10 (Ubiquinol) 200 mg cap Take 200 mg by mouth four times weekly.   ? dulaglutide (TRULICITY) 0.75 mg/0.5 mL injection pen Initiate at 0.75 mg subcutaneously once weekly. Increase the dose to 1.5 mg once weekly for additional glycemic control. If additional glycemic control is needed, increase the dose to  3 mg once weekly after at least 4 weeks on the 1.5 mg dose (2.1). If additional glycemic control is needed, increase to the maximum dose of 4.5 mg once weekly after at least 4 weeks on the 3 mg dose. If a dose is missed, administer the missed dose as soon as possible if there are at least 3 days (72 hours) until the next scheduled dose Administer once weekly at any time of day with or without food Inject subcutaneously in the abdomen, thigh, or upper arm  Indications: type 2 diabetes mellitus   ? ergocalciferol (vitamin D2) (VITAMIN D PO) Take  by mouth.   ? ezetimibe (ZETIA) 10 mg tablet Take 10 mg by mouth daily.   ? famotidine (PEPCID) 20 mg tablet Take 20 mg by mouth daily.   ? Lactobac. rhamnosus GG-inulin (CULTURELLE DIGESTIVE HEALTH) 10 billion cell -200 mg cap Take  by mouth.   ? levocetirizine 5 mg tab Take 5 mg by mouth at bedtime daily.   ? losartan-hydrochlorothiazide (HYZAAR) 100-25 mg tablet Take 1 tablet by mouth every morning.   ? magnesium oxide (MAG-OX) 400 mg (241.3 mg magnesium) tablet Take 500 mg by mouth daily.   ? montelukast (SINGULAIR) 10 mg tablet Take 10 mg by mouth at bedtime daily.   ? pantoprazole DR (PROTONIX) 40 mg tablet Take 40 mg by mouth daily.   ? potassium chloride SR (K-DUR) 10 mEq tablet Take 10 mEq by mouth daily. Take with a meal and a full glass of water.   ? topiramate (TOPAMAX) 100 mg tablet Take 100 mg by mouth at bedtime daily.   ? vitamin E acetate (VITAMIN E PO) Take 180 mg by mouth four times weekly.     Vitals:    06/30/21 1545   BP: 137/81   BP Source: Arm, Right Upper   Pulse: 95   Temp: 36.6 ?C (97.8 ?F)   SpO2: 97%   TempSrc: Oral   PainSc: Zero   Weight: 80.6 kg (177 lb 12.8 oz)   Height: 152.4 cm (5')     Body mass index is 34.72 kg/m?Marland Kitchen     Labs and Diagnostic Test:  MELD:  MELD Score 06/10/2020 05/17/2019 12/15/2016   MELD Score 7 6 6        Basic Labs:  Basic Labs Latest Ref Rng & Units 03/06/2021 06/10/2020   NA 136 - 145 mmol/L 136 137   K 3.5 - 5.1 mmol/L 3.4(L) 3.8   CL 98 - 107 mmol/L 99 105   BUN 9.8 - 20.1 mg/dL 35.0(H) 22.0(H)   CR 0.57 - 1.11 mg/dL 1.61 0.96   GLUX 70 - 045 MG/DL - -   CA 8.4 - 40.9 mg/dL 81.1 9.9   TP 6.2 - 8.1 g/dL 7.5 7.3   TBILI 0.2 - 1.2 mg/dL 9.14 7.82   ALB 3.5 - 5.0 g/dL 4.4 4.1   ALKP 956 - 213 ML 118(L) 118   AST 5 - 34 U/L 20 19   ALT 0 - 55 U/L 22 33   GFR >59 - 54.0(L)   GFRAA >60 mL/min - -   GAP 8 - 16 ML 12 16(H)   HBA1C <5.7 - -   VITD 30 - 100 ng/mL - 34 CHOL <200 mg/dL 086(V) 784(O)   TRIG <962 mg/dL 952(W) 413(K)   HDL mg/dL 46 44   LDL <440 102(V) 136(H)   VLDL 5 - 40 ML 39 31   WBC 2.53 - 10.04 X10-3/uL 7.18 6.0  RBC 3.93 - 5.22 x10-6/uL 5.53(H) 5.47(H)   HGB 11.2 - 15.7 g/dL 16.1 09.6   MCV 04.5 - 94.8 fL 83.0 87.5   MCH 25.6 - 32.2 pg 28.0 28.9   MCHC 32.2 - 35.5 g/dL 40.9 81.1   PLT - 914 782   MPV 9.4 - 12.4 fL 11.4 -   RDW - 13.5 14.2   NEUT 34.0 - 71.1 % 62.4 56.9   ANC 1.56 - 6.13 x10-3/uL 4.48 3.4   LYMA 24 - 44 % - -   ALYM 1.18 - 2.74 x10-3/uL 1.88 2.0   MONA 4 - 12 % - -   AMONO 0.24 - 0.86 x10-3/uL 0.58 0.4   EOS 0.7 - 7.0 % 2.2 2.1   AEOS 0.04 - 0.54 x10-3/uL 0.16 0.1   BASA 0 - 2 % - -   ABAS 0.01 - 0.08 x10-3/uL 0.06 0.1   INR - - 1.0       Metabolic Labs:  Metabolic Labs Latest Ref Rng & Units 03/06/2021 06/10/2020   Alk Phos 140 - 150 ML 118(L) 118   Ceruloplasmin 20.0 - 60.0 mg/dL - -   HGB N5A <2.1 - -   Vitamin D 30 - 100 ng/mL - 34       Autoimmune Labs:  Autoimmune Labs Latest Ref Rng & Units 12/15/2016   AMA <20 TITER <20   ANA <80 TITER <80   ASM <20 TITER SEE TITER       Hepatitis Labs:  Hepatitis Tests Latest Ref Rng & Units 05/17/2019 12/15/2016   HBV Surface Ab Non-Reactive Reactive(A) <2.5   HBV Surface Ag NEG-NEG - NEG       Drug Levels  No flowsheet data found.    Imaging:      Physical Exam   Gen: well appearing female, in no acute distress  HEENT: NCAT, no scleral icterus  Neck: supple  CV: well perfused  Resp: normal work of breathing  Abd: Soft, nondistended,  Ext: perfused, no edema  Derm:no rashes, no jaundice       Assessment and Plan:  Gabrielle Andrade is a pleasant 61 yo F with HTN, DM, hyperlipidemia, who presenting for follow up for NAFLD.    Non-alcoholic fatty liver disease  Diabetes, hyperlipidemia  Class 1 obesity  - patient with F0 on last fibroscan in 04/2018. She has no evidence of progression of fibrosis on labs or history/exam  - she has been unable to get GLP 1 agonist, which would be beneficial to treat her diabetes and promote additional weight loss. She will follow up with her PCP on this. Could consider weight loss management in the future, but she lives about 2 hours away from [place]  - her last labs in 02/2021 with AST 20, ALT 22, Alk phos 118  - PCP will check her labs again in July 2023. She will send these to [name] and we can see if there is any response to her taking Januvia  - PCP will continue to manage high cholesterol in addition to diabetes  - she was counseled on ongoing diet modifications    RTC in 12 months fasting and will repeat fibroscan at that time

## 2021-07-01 DIAGNOSIS — R7302 Impaired glucose tolerance (oral): Secondary | ICD-10-CM

## 2021-07-02 ENCOUNTER — Encounter: Admit: 2021-07-02 | Discharge: 2021-07-02 | Payer: BC Managed Care – HMO

## 2021-07-02 DIAGNOSIS — E119 Type 2 diabetes mellitus without complications: Secondary | ICD-10-CM

## 2021-07-02 LAB — HEMOGLOBIN A1C: HEMOGLOBIN A1C: 7.8 % — ABNORMAL HIGH

## 2021-07-09 ENCOUNTER — Encounter: Admit: 2021-07-09 | Discharge: 2021-07-09 | Payer: BC Managed Care – HMO

## 2021-09-22 ENCOUNTER — Encounter: Admit: 2021-09-22 | Discharge: 2021-09-22 | Payer: BC Managed Care – HMO

## 2021-10-20 ENCOUNTER — Encounter: Admit: 2021-10-20 | Discharge: 2021-10-20 | Payer: BC Managed Care – HMO

## 2021-11-06 ENCOUNTER — Encounter: Admit: 2021-11-06 | Discharge: 2021-11-06 | Payer: BC Managed Care – HMO

## 2021-11-10 ENCOUNTER — Encounter: Admit: 2021-11-10 | Discharge: 2021-11-10 | Payer: BC Managed Care – HMO

## 2021-11-10 DIAGNOSIS — R748 Abnormal levels of other serum enzymes: Secondary | ICD-10-CM

## 2021-11-10 DIAGNOSIS — K760 Fatty (change of) liver, not elsewhere classified: Secondary | ICD-10-CM

## 2021-11-10 LAB — LIPID PROFILE
CHOLESTEROL: 205 — ABNORMAL HIGH (ref <200)
LDL: 111 — ABNORMAL HIGH (ref <100)
TRIGLYCERIDES: 244 — ABNORMAL HIGH (ref <150)

## 2021-11-10 LAB — COMPREHENSIVE METABOLIC PANEL
ALBUMIN: 4.3
ALK PHOSPHATASE: 139
ALT: 38
ANION GAP: 12
AST: 31
BLD UREA NITROGEN: 29 — ABNORMAL HIGH (ref 9.8–20.1)
CALCIUM: 10 — ABNORMAL HIGH (ref 8.4–10.2)
CHLORIDE: 108 — ABNORMAL HIGH (ref 98–107)
CO2: 17 — ABNORMAL LOW (ref 23–31)
CREATININE: 1 mg/dL
EGFR: 58 — ABNORMAL LOW (ref >59)
GLUCOSE,RANDOM: 206 — ABNORMAL HIGH (ref 70–105)
POTASSIUM: 3.9
SODIUM: 137 mmol/L — ABNORMAL HIGH (ref 5–40)
TOTAL BILIRUBIN: 0.3 mg/dL
TOTAL PROTEIN: 7.7

## 2022-03-20 IMAGING — MG MAMMOGRAM 3D SCREEN, BILATERAL
8 series · 8 of 8 positions shown · non-contrast
Comparison: none

[R CC]
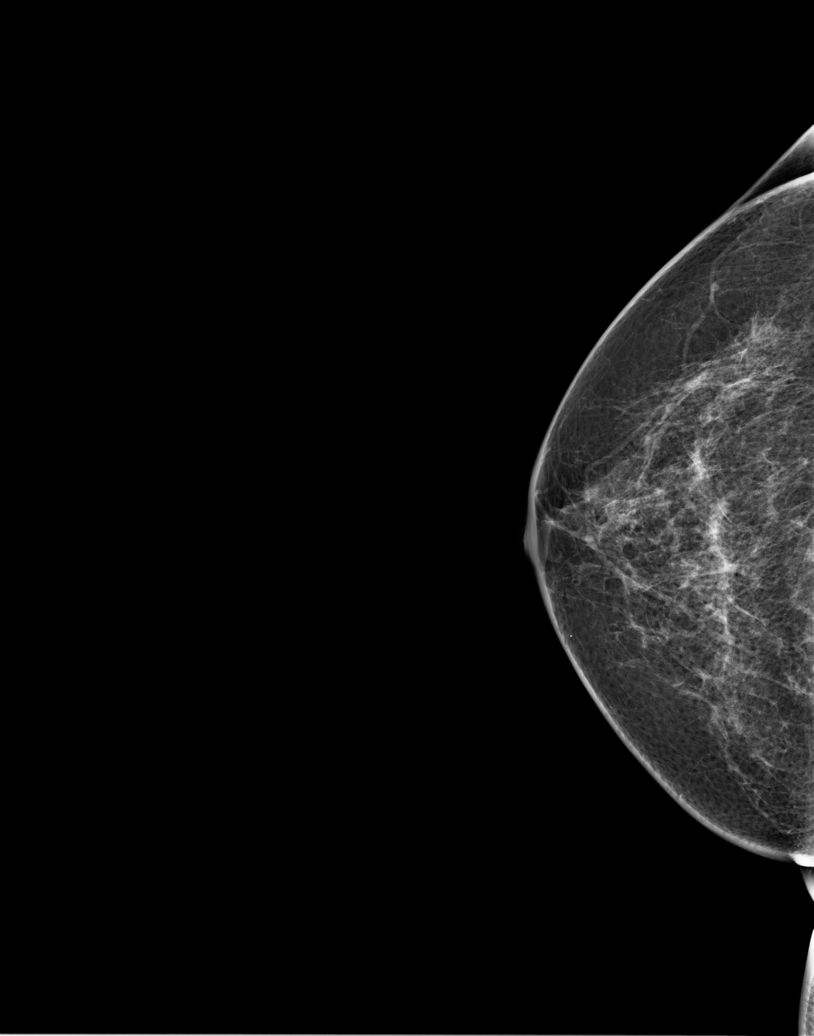

[R tomo (1 of 2)]
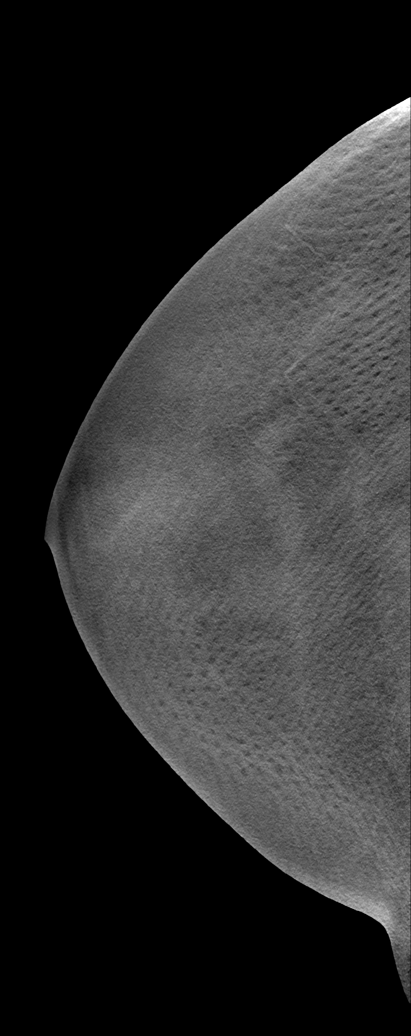

[L CC]
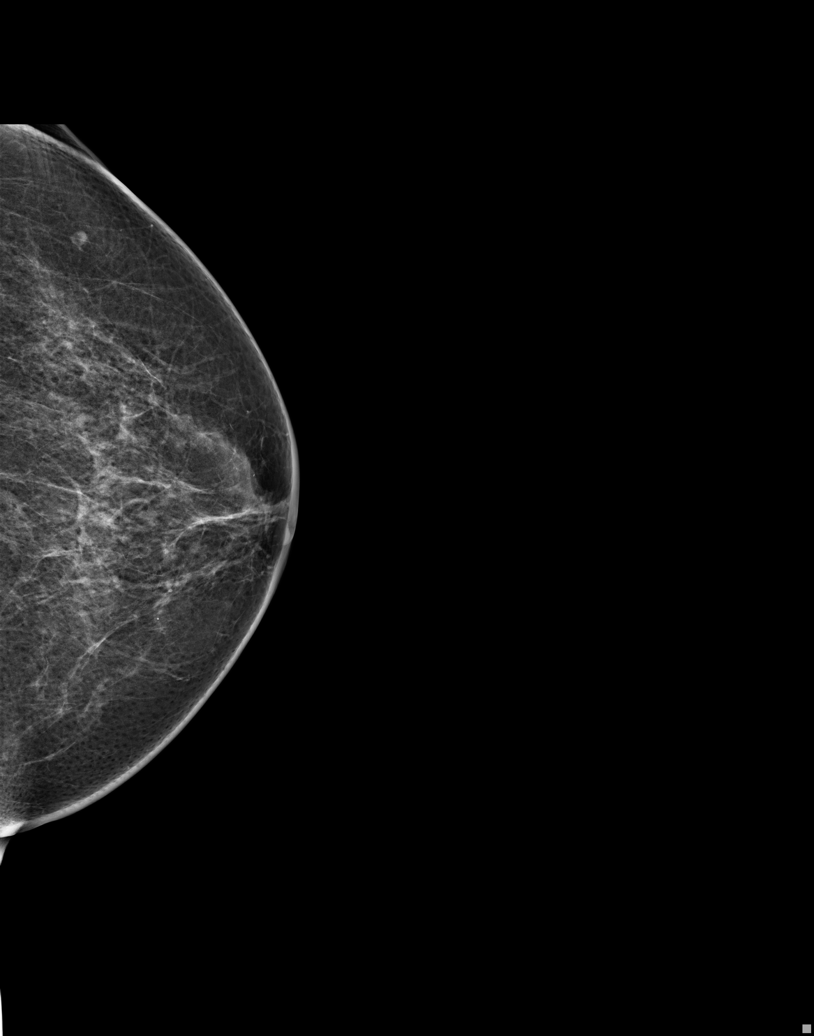

[L tomo (1 of 2)]
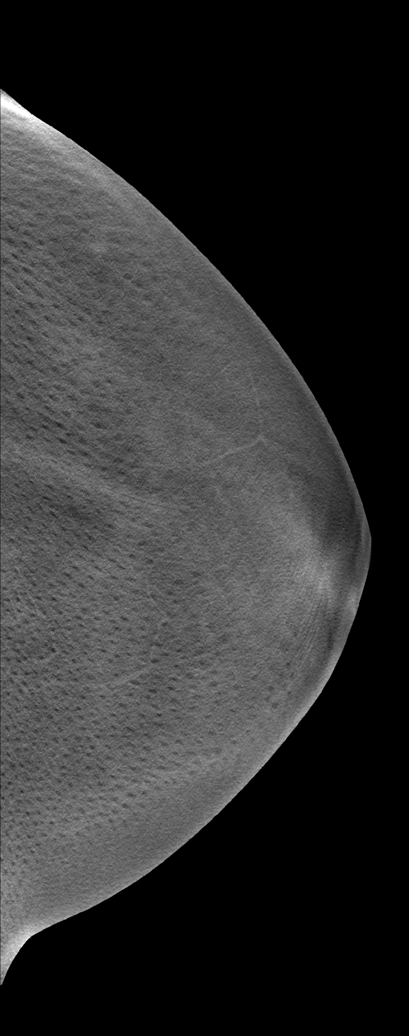

[R MLO]
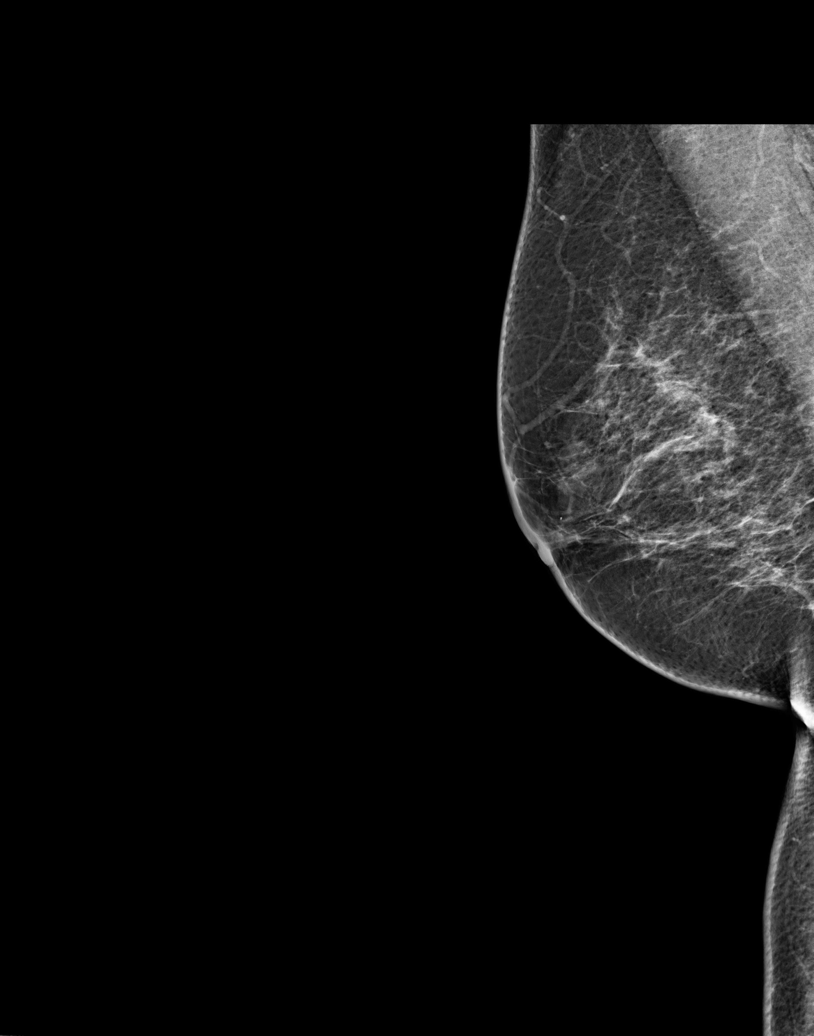

[R tomo (2 of 2)]
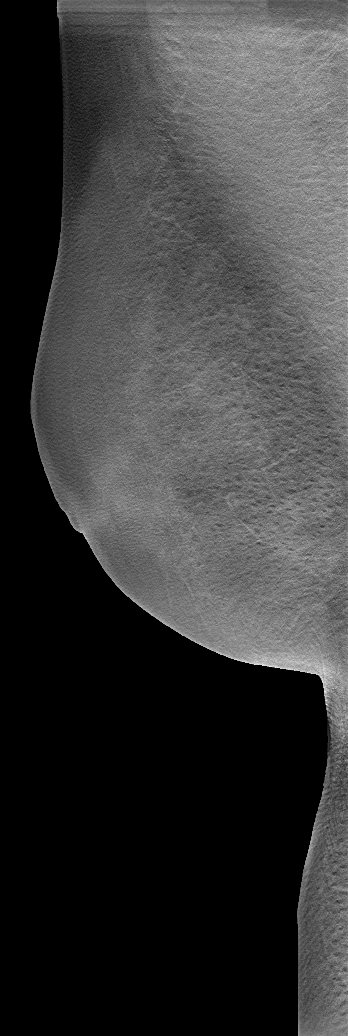

[L MLO]
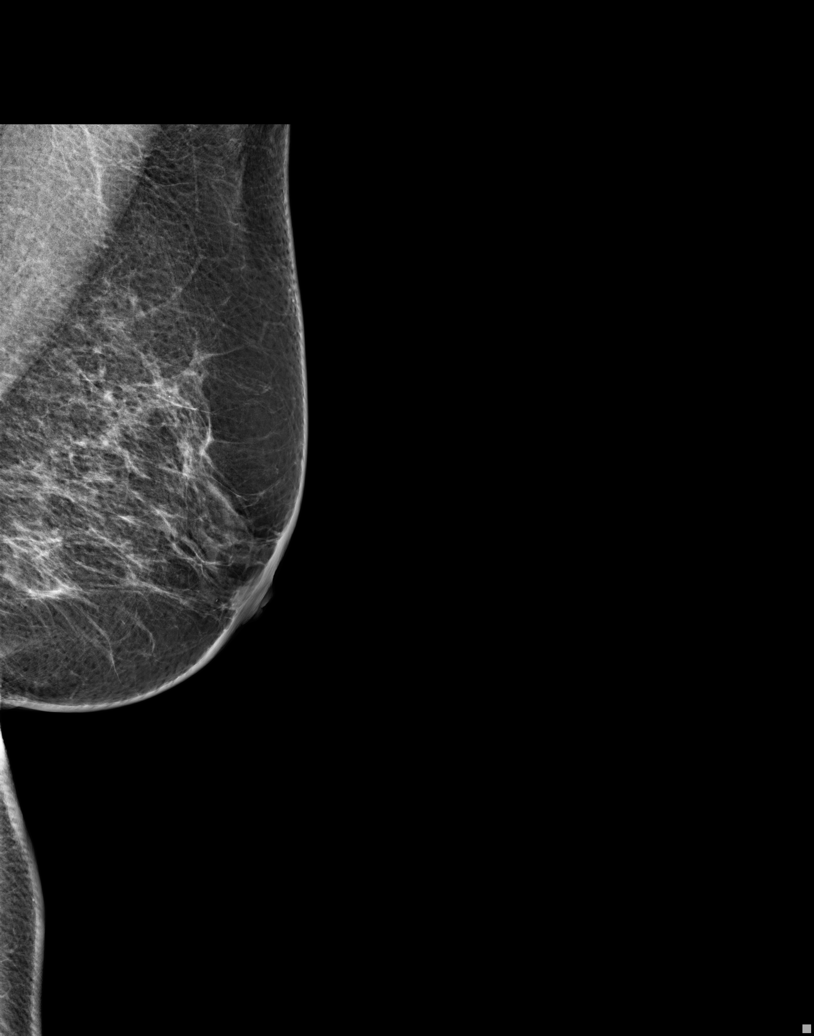

[L tomo (2 of 2)]
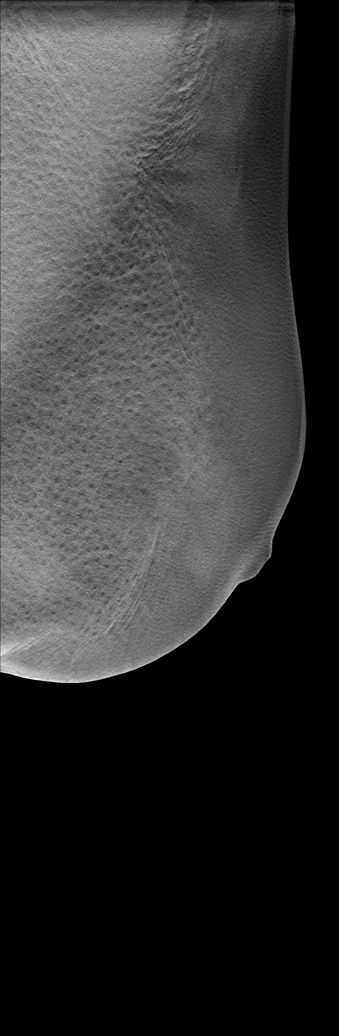

[8 of 8 positions shown; findings below may reference images not displayed]

EXAM

3D SCREENING MAMMOGRAM, BILATERAL

INDICATION

screening
TC SCORE: 10 YR RISK (February 2021) =  2.5%, LIFETIME RISK=  5.3%.  SCREENING.  AB (3D) PRIORS: 3334.

TECHNIQUE

Digital 2D CC and MLO projections obtained with 3D tomographic views per manufacturer's protocol.
ICAD version 7.2 was used during this exam.

COMPARISONS

February 23, 2020

FINDINGS

ACR Type 2:  25-50% There are scattered fibroglandular densities.

A few small unchanged rounded densities are seen. No concerning masses or calcifications are
evident.

IMPRESSION

Stable mammography. One year follow-up is recommended.

BI-RADS 2, BENIGN.

Tech Notes:

## 2022-06-22 ENCOUNTER — Encounter: Admit: 2022-06-22 | Discharge: 2022-06-22 | Payer: BC Managed Care – HMO

## 2022-06-22 NOTE — Telephone Encounter
Pt would like to cancel 4-8 appointment and would like to reschedule.

## 2022-06-22 NOTE — Telephone Encounter
Gabrielle Andrade, phone just rang no VM came on

## 2022-07-06 ENCOUNTER — Encounter: Admit: 2022-07-06 | Discharge: 2022-07-06 | Payer: BC Managed Care – HMO

## 2022-08-03 ENCOUNTER — Encounter: Admit: 2022-08-03 | Discharge: 2022-08-03 | Payer: No Typology Code available for payment source

## 2022-08-03 ENCOUNTER — Ambulatory Visit: Admit: 2022-08-03 | Discharge: 2022-08-03 | Payer: No Typology Code available for payment source

## 2022-08-03 DIAGNOSIS — K7581 Nonalcoholic steatohepatitis (NASH): Secondary | ICD-10-CM

## 2022-08-03 DIAGNOSIS — K760 Fatty (change of) liver, not elsewhere classified: Secondary | ICD-10-CM

## 2022-08-03 DIAGNOSIS — E785 Hyperlipidemia, unspecified: Secondary | ICD-10-CM

## 2022-08-03 DIAGNOSIS — E119 Type 2 diabetes mellitus without complications: Secondary | ICD-10-CM

## 2022-08-03 DIAGNOSIS — R079 Chest pain, unspecified: Secondary | ICD-10-CM

## 2022-08-03 DIAGNOSIS — Z713 Dietary counseling and surveillance: Secondary | ICD-10-CM

## 2022-08-03 DIAGNOSIS — I10 Essential (primary) hypertension: Secondary | ICD-10-CM

## 2022-08-03 NOTE — Procedures
Indication and Probe:  Indication:: NAFLD/NASH  Probe:: XL-Probe    E (Elasticity) Median (kPa): 4  IQR / Median (%): 23  CAP Median (dB/m): 388    Interpretation  Fibrosis Interpretation: Other: F0-F1:  No to Minimal Fibrosis  Steatosis Score Correlation/Estimation: >290   S3: >67% Steatosis

## 2022-08-03 NOTE — Progress Notes
Hepatology Clinic Note  Patient Name:Gabrielle Andrade - DOB: Apr 18, 1960- ZOX:[id number] - Date of Service: 08/03/2022   Native Liver Disease: MASLD  Last clinic visit: 06/30/2021 Dr. [place]  [id number], Efraim Kaufmann      Impression/Assessment/Plan/Recommendations:    Metabolic Associated Steatotic Liver Disease (MASLD):  Recommendation for MASLD management:  It is recommended to lose 5% of body weight in order to reduce steatosis; 10% to reduce inflammation associated with NASH.   Weight loss may be achieved through a combination of diet (such as the mediterranean diet), in conjunction with exercise. Goal is to exercise >150 minutes week.  Consider liberalizing coffee and black tea consumption as data suggest this may slow progression of NASH-related fibrosis.  The highest mortality in NAFLD/NASH is from cardiovascular disease and careful attention should be paid to associated risk factors, including management of HTN, HLP, DM2. --> Elevated blood sugars averaging 180-200s. A1c increased from 7.1 to 9.0 per patient report.  Previous use of GLP1-agonist;however, discontinued due to cost  (~$500/month). (Additional history noted to be positive for gastroparesis).  Statin use is safe and encouraged.    Transient Elastography (FibroScan):  Calculated FIB 4 score 0.92 (03/06/21). To obtain updated labs.  Plan to repeat re-stratification for fibrosis progression/regression next office visit with an updated FibroScan.    08/03/22:  E (Elasticity) Median (kPa): 4  IQR / Median (%): 23  CAP Median (dB/m): 388    Bone health:   Baseline DEXA scan should be performed to evaluate for decreased bone mineral density.   Screen for vitamin D deficiency with supplement replacement if necessary.   Recommend calcium +Vitamin D supplementation (2000 IU of Vitamin D and 1200mg  Calcium).    Vaccinations:  Recommend age appropriate vaccination including yearly influenza and Pneumovax.  Hepatitis A/B (Strongly recommend vaccinations for hepatitis A and B if the patient not immune.)  Recommend booster series if low/no immunity. May obtain with our office or with PCP office.   Reviewed titers. Noted to have immunity (Hepatitis A & B).    Routine Health Care in Patient with Chronic Liver Disease:  All patients with liver disease should avoid the use of Non-steroidal Anti-Inflammatory (NSAID) medications as they can cause significant injury to the kidneys in this population.  The preferred analgesic in liver disease is Tylenol, up to 2g daily.  Recommended avoidance of herbal supplements and over the counter herbal remedies due to potential hepatic toxicities.     ==========================================================      Subjective     Gabrielle Andrade is a 62 y.o. female presenting alone today for continued management of [place] associated steatotic liver disease.     Additional pertinent health history includes: HTN, DM, hyperlipidemia, fibromyalgia, irritable bowel syndrome, and gastroparesis      Briefly, patient presented to hepatology clinic in 2018 for evaluation of hepatic steatosis.  An abdominal CT for further evaluation of hematuria 10/2016 noting extensive hepatic steatosis, minimal splenic granulomatous disease, mild colonic diverticulosis, and atelectasis.    She subsequently had an ultrasound on October 29, 2016 which showed hepatic steatosis, hepatomegaly (liver measuring 18 cm), normal size spleen, calcified granuloma within the spleen, and common bile duct 5.7 mm.     Please refer to hepatology documentation noted on 06/01/2019 for further details.       Interval Health History:  Since seen in clinic last, patient presents with concerns about persistently elevated blood glucose levels despite adherence to insulin therapy. She reports average blood glucose readings in the range of 180s to 200s,  with occasional spikes above this range. The patient expresses frustration at the lack of correlation between dietary habits and blood glucose levels, noting instances where strict adherence to a low-carbohydrate diet did not result in expected glucose reduction. Encouraged discussion with PCP of CGM.    The patient also reports a history of COVID-19 infection approximately a year ago, with residual symptoms including persistent cough and fatigue. She describes episodes of tremors, which she associates with fluctuations in blood glucose levels.    The patient has been on escitalopram since November for suspected depression, but is considering discontinuing it due to excessive fatigue. She also mentions taking glipizide for diabetes management and occasional use of a pain medication for back and shoulder discomfort, which she attributes to a fall six to seven months ago.    The patient also reports a cataract in the left eye, for which surgery is being considered, pending improvement in her A1c levels. She expresses concern about the cost of certain diabetes medications, which has led to discontinuation of some treatments.     Updated labs (outside records) 09/09/2021 noting AST 31, ALT 38, alkaline phosphatase 139, total protein 7.7, albumin 4.3, creatinine 1.02.  She notes that she has had updated labs in both November 2023 and March 2024 with her local primary care provider.  Will obtain records.    She denies any additional hepatic decompensation events such as ascites, variceal hemorrhage, encephalopathy, or jaundice not noted above.    Today, the patient denies presence of abdominal pain, nausea, vomiting, bowel movement changes, blood in the stool, pruritus, day-night reversal and episodes of confusion.    Does not have any noted limitation in mobility today.     Patient reports active participation as noted below:  Tobacco Use: None  Alcohol Use: rare, once-twice per year  Substance Use: None    Hospitalizations/ER/Urgent Care since last OV: None    Review of Systems  A complete 10 point review of systems was negative except those listed in the HPI.    Reviewed and updated active medication and allergy list.    albuterol (VENTOLIN HFA) 90 mcg/actuation inhaler Inhale two puffs by mouth into the lungs every 6 hours as needed for Wheezing or Shortness of Breath. Shake well before use.    ascorbic acid (VITAMIN C PO) Take 1,000 mg by mouth four times weekly. emergency vitamin C drink    calcium carbonate/vitamin D-3 (OSCAL-500+D) 1250 mg/200 unit tablet Take one tablet by mouth daily. Calcium Carb 1250mg  delivers 500mg  elemental Ca    Cinnamon Bark (CINNAMON) 500 mg cap Take  by mouth.    CoQ10 (Ubiquinol) 200 mg cap Take one capsule by mouth four times weekly.    cyclobenzaprine HCl (FLEXERIL PO) Take 10 mg by mouth nightly as needed (2 nights per week per patient report).    ergocalciferol (vitamin D2) (VITAMIN D PO) Take  by mouth.    escitalopram oxalate (LEXAPRO) 10 mg tablet Take one tablet by mouth daily.    ezetimibe (ZETIA) 10 mg tablet Take one tablet by mouth daily.    famotidine (PEPCID) 20 mg tablet Take one tablet by mouth daily.    glipiZIDE CR (GLUCOTROL XL) 10 mg tablet Take two tablets by mouth daily. Take 2 tabs by mouth daily    insulin glargine (LANTUS SOLOSTAR U-100 INSULIN) 100 unit/mL (3 mL) subcutaneous PEN Inject fifteen Units under the skin at bedtime daily.    levocetirizine 5 mg tab Take one tablet by mouth at bedtime  daily.    losartan-hydrochlorothiazide (HYZAAR) 100-25 mg tablet Take one tablet by mouth every morning.    magnesium oxide (MAG-OX) 400 mg (241.3 mg magnesium) tablet Take 500 mg by mouth daily.    montelukast (SINGULAIR) 10 mg tablet Take one tablet by mouth at bedtime daily.    pantoprazole DR (PROTONIX) 40 mg tablet Take one tablet by mouth daily.    potassium chloride SR (K-DUR) 10 mEq tablet Take one tablet by mouth daily. Take with a meal and a full glass of water.    topiramate (TOPAMAX) 100 mg tablet Take one tablet by mouth at bedtime daily.    vitamin E acetate (VITAMIN E PO) Take 180 mg by mouth four times weekly. Objective:         BP (!) 144/78 (BP Source: Arm, Right Upper, Patient Position: Sitting)  - Pulse 76  - Temp 36.9 ?C (98.5 ?F)  - Wt 81.4 kg (179 lb 6.4 oz)  - SpO2 100%  - BMI 35.04 kg/m?     Physical Exam:  Vitals reviewed.   Constitutional: Well-developed, well-nourished, in no apparent distress.    HEENT: Normocephalic. No noted scleral icterus.   GI:  Abdomen soft, non-distended, non-tender. BS present. No shifting dullness.   Skin:  Skin is warm and dry. No rash noted.  No jaundice. No noted spider nevi noted.  No palmar erythema.  Peripheral Vascular: No lower extremity edema.  Musculoskeletal:  ROM intact, No muscle wasting.   Psychiatric: Normal mood and affect. Behavior is normal.  Neuro:  No asterixis, No noted tremor.         A total of 50 minutes was spent in the following activities: Preparing to see the patient, obtaining and/or reviewing separately obtained history, performing a medically appropriate examination and/or evaluation, counseling and educating the patient/family/caregiver, ordering medications/laboratory tests/procedures, referring and communication with other healthcare professionals, documenting clinical information in the electronic or other health record, and independently interpreting and communicating results (not separately reported) to the patient/family/caregiver.    Patient/patient family reports that all questions have been answered at this time. No additional pending concerns at conclusion of office visit.    If you have any further questions, please don't hesitate to contact our office. This note was created using Dragon, a Chemical engineer.  Please contact my office for any clarification of documentation.    Follow Up:   Return in about 1 year (around 08/03/2023) for In-Person, Dr. [place], 1 year, Return Fasting.  _____________________________  Marcelyn Bruins, APRN-C  Hepatology & Liver Transplantation  [facility]  Office: [phone]  Fax: [phone]

## 2022-08-03 NOTE — Patient Instructions
Reason for Visit  Metabolic Associated Fatty Liver Diease (MAFLD)  08/03/22    Patient Information/Education:  We did a Fibroscan of your liver today. It estimated how much fibrosis, or scarring, is on your liver. It also estimates the amount of fat on your liver.  Your results show significant amount of fat or steatosis and little to no scarring.     Labs  We will request your lab results from your primary care office.    Medications  We recommend you avoid herbal supplements and over the counter herbal remedies due to potential hepatic toxicities.      Fatty Liver Management   Fatty liver disease is when fat deposits in the liver. This can lead to inflammation, and chronic inflammation can ultimately lead to scarring and cirrhosis. The only way to differentiate between benign steatosis (some fat in the liver) vs metabolic dysfunction associated hepatitis (MASH-fatty liver with inflammation) is to perform a liver biopsy.  Try to lose 5-10% body weight in the next 6-12 months to help with the fatty liver deposits.  Work with your primary care provided to keep your blood cholesterol, blood sugar, and blood pressure under control.  We recommend 30-45 minutes of exercise each day. Even if you don't lose weight, it can help your fatty liver disease.  1-2 cups of black coffee per day can help with fatty liver. Any black tea or black coffee is okay.  We also recommend following a Mediterranean Diet.    Notify our office if you experience yellowing of the eyes or skin, dark amber urine, memory loss, confusion, swelling of the extremities or abdomen, or blood in the stool as these can all be complications from liver disease.     General Instructions:  How to reach [name]:   Please send a MyChart message to the Transplant Hepatology clinic or call us at [phone] and ask for [facility]. Our office hours are 8am-4:30pm.  How to get a medication refill:  Please use the MyChart Refill request or contact your pharmacy directly to request medication refills. Please allow 48 hours.     How to receive your test results:  Results are released to your MyChart account immediately, please ensure you are utilizing this tool to review results after your clinic visit. Please allow time for your physician to review your results. If you don't have a MyChart account, you will receive a phone call or letter.  If you are expecting results and have not heard from our office within 10 business days of your testing, please call.  If you have labs, imaging, or admission at another facility please let us know so we can request results.   Scheduling:  Our Scheduling phone number is [phone].    Support groups for many chronic illnesses are available through [facility]: [email or web address] or [phone].    [facility]  Dr. Jan Fireman or Marcelyn Bruins, APRN  Nurse Coordinator: Marcha Dutton  [phone]    Check-out:  1. Return to clinic in 1  year  with Dr. Paulette Blanch Talaat In-person Gen Hep return fasting for Fibroscan

## 2022-08-10 ENCOUNTER — Encounter: Admit: 2022-08-10 | Discharge: 2022-08-10 | Payer: No Typology Code available for payment source

## 2022-08-10 NOTE — Telephone Encounter
Request labs from pcp Dr. Herschell Dimes

## 2022-08-12 ENCOUNTER — Encounter: Admit: 2022-08-12 | Discharge: 2022-08-12 | Payer: No Typology Code available for payment source

## 2022-08-12 DIAGNOSIS — R748 Abnormal levels of other serum enzymes: Secondary | ICD-10-CM

## 2022-08-12 DIAGNOSIS — E7849 Other hyperlipidemia: Secondary | ICD-10-CM

## 2022-08-12 DIAGNOSIS — K760 Fatty (change of) liver, not elsewhere classified: Secondary | ICD-10-CM

## 2022-09-01 ENCOUNTER — Encounter: Admit: 2022-09-01 | Discharge: 2022-09-01 | Payer: No Typology Code available for payment source

## 2022-10-12 ENCOUNTER — Encounter: Admit: 2022-10-12 | Discharge: 2022-10-12 | Payer: No Typology Code available for payment source

## 2022-10-12 DIAGNOSIS — K760 Fatty (change of) liver, not elsewhere classified: Secondary | ICD-10-CM

## 2022-10-12 DIAGNOSIS — R748 Abnormal levels of other serum enzymes: Secondary | ICD-10-CM

## 2023-08-02 ENCOUNTER — Encounter: Admit: 2023-08-02 | Discharge: 2023-08-02 | Payer: PRIVATE HEALTH INSURANCE

## 2023-08-02 DIAGNOSIS — K7581 Nonalcoholic steatohepatitis (NASH): Secondary | ICD-10-CM

## 2023-08-02 DIAGNOSIS — K760 Fatty (change of) liver, not elsewhere classified: Secondary | ICD-10-CM

## 2023-08-02 NOTE — Telephone Encounter
 Pt called in to provide fax number to fax labs to. [phone]

## 2023-08-02 NOTE — Telephone Encounter
 Orders faxed

## 2023-08-02 NOTE — Telephone Encounter
 RN spoke to Gabrielle Andrade, she has PCP apt 5/14/ Will send labs to PCP office to be drawn with her other labs.

## 2023-08-02 NOTE — Telephone Encounter
 Pt called regarding her labs and scan before her appt, she also has labs on Wednesday w/ her PCP. Please call her back.

## 2023-08-05 ENCOUNTER — Encounter: Admit: 2023-08-05 | Discharge: 2023-08-05 | Payer: PRIVATE HEALTH INSURANCE

## 2023-08-05 NOTE — Progress Notes
 Hepatology Clinic Note  Patient Name:Gabrielle Andrade - DOB: 04/13/1960- DGU:[id number] - Date of Service: 08/06/2023   Native Liver Disease: MASLD   Last clinic visit: 08/03/22      Impression/Assessment/Plan/Recommendations:    Metabolic Associated Steatotic Liver Disease (MASLD):  Recommendation for MASLD management:  It is recommended to lose 5% of body weight in order to reduce steatosis; 10% to reduce inflammation associated with NASH.   Weight loss may be achieved through a combination of diet (such as the mediterranean diet), in conjunction with exercise. Goal is to exercise >150 minutes week.  Consider liberalizing coffee and black tea consumption as data suggest this may slow progression of NASH-related fibrosis.  The highest mortality in NAFLD/NASH is from cardiovascular disease and careful attention should be paid to associated risk factors, including management of HTN, HLP, DM2, OSA.   Statin use is safe and encouraged.  Previous use of GLP1-agonist;however, discontinued due to cost  (~$500/month). (Additional history noted to be positive for gastroparesis).  To discuss use of CGM with PCP to help further control DM.     Transient Elastography (FibroScan):  Calculated FIB 4 score - to obtain outside records.   Plan to repeat re-stratification for fibrosis progression/regression in 1 year with an updated FibroScan.    08/06/23:  E (Elasticity) Median (kPa): 5.2  IQR / Median (%): 15  CAP Median (dB/m): 370    08/03/22:  E (Elasticity) Median (kPa): 4  IQR / Median (%): 23  CAP Median (dB/m): 388    Vaccinations:  Recommend age appropriate vaccination including yearly influenza and Pneumovax.  Hepatitis A/B (Strongly recommend vaccinations for hepatitis A and B if the patient not immune.)  Recommend booster series if low/no immunity. May obtain with our office or with PCP office.   Noted to have immunity (Hepatitis A & B).     Routine Health Care in Patient with Chronic Liver Disease:  All patients with liver disease should avoid the use of Non-steroidal Anti-Inflammatory (NSAID) medications as they can cause significant injury to the kidneys in this population.  The preferred analgesic in liver disease is Tylenol, up to 2g daily.  Recommended avoidance of herbal supplements and over the counter herbal remedies due to potential hepatic toxicities.    ==========================================================      Subjective   History of Present Illness   Ms.Wenk is a 63 y.o. female presenting alone today for continued management of MASLD.     Additional pertinent health history includes: HTN, DM, hyperlipidemia, fibromyalgia, irritable bowel syndrome, gastroparesis       Briefly, patient presented to hepatology clinic in 2018 for evaluation of hepatic steatosis. An abdominal CT for further evaluation of hematuria 10/2016 noting extensive hepatic steatosis, minimal splenic granulomatous disease, mild colonic diverticulosis, and atelectasis.     She subsequently had an ultrasound on October 29, 2016 which showed hepatic steatosis, hepatomegaly (liver measuring 18 cm), normal size spleen, calcified granuloma within the spleen, and common bile duct 5.7 mm.      Please refer to hepatology documentation noted on 06/01/2019 for further details.     Interval Health History:  Since seen in clinic last, patient reports  elevated liver enzymes and concerns about diabetes management.    Elevated liver enzyme levels were noted during a recent check-up with her PCP.    She notes a history of gastroparesis, which improved after discontinuing metformin. No yellowing of skin or eyes, nausea, vomiting, or stooling blood. Stomach issues have improved since stopping metformin.  She experiences fluctuations in blood sugar levels despite being on Lantus at a dose of 30 units. Blood sugar was 212 mg/dL in the morning and increased to 339 mg/dL after eating a breakfast bowl from [employer]. Her A1c decreased from 9.8% to 8.7% or 8.3% as of November. She is concerned about the difficulty in managing her blood sugar levels.     She has a history of elevated calcium scores and a recent heart evaluation showed some blockage. She has been denied insurance coverage for certain medications to manage cholesterol due to her primary care provider not being a cardiologist. She has a family history of heart disease, as her father has had similar issues. She is concerned about high cholesterol levels and finds it challenging to manage her diet due to conflicting dietary recommendations for her various health conditions.    She has been experiencing swelling in her legs and has gained 20 pounds. Potassium levels were low at 2.9 mmol/L, but improved to 3.4 mmol/L with supplementation. She has a history of thyroid issues diagnosed in 01/2023. She has been experiencing fluid retention.    She has worsening vision due to cataracts, with her left eye almost legally blind, and is planning for surgery.    She does not consume tobacco or alcohol regularly, only occasionally having a margarita.     She denies any additional hepatic decompensation events such as ascites, variceal hemorrhage, encephalopathy, or jaundice not noted above.    Today, the patient denies presence of abdominal pain, nausea, vomiting, bowel movement changes, overt blood loss (coffee grounds, hematemesis, hematochezia, melenic appearing stools), pruritus, day-night reversal, or episodes of confusion.    Does not have any noted limitation in mobility today.     Patient reports active participation as noted below:  Tobacco Use: None  Alcohol Use: Rare, once-twice per year  Substance Use: None    Hospitalizations/ER/Urgent Care since last OV: None    Review of Systems  A complete 10 point review of systems was negative except those listed in the HPI.    Reviewed and updated active medication and allergy list.    albuterol (VENTOLIN HFA) 90 mcg/actuation inhaler Inhale two puffs by mouth into the lungs every 6 hours as needed for Wheezing or Shortness of Breath. Shake well before use.    ascorbic acid (VITAMIN C PO) Take 1,000 mg by mouth four times weekly. emergency vitamin C drink    calcium carbonate/vitamin D-3 (OSCAL-500+D) 1250 mg/200 unit tablet Take one tablet by mouth daily. Calcium Carb 1250mg  delivers 500mg  elemental Ca    Cinnamon Bark (CINNAMON) 500 mg cap Take  by mouth.    CoQ10 (Ubiquinol) 200 mg cap Take one capsule by mouth four times weekly.    cyclobenzaprine HCl (FLEXERIL PO) Take 10 mg by mouth nightly as needed (2 nights per week per patient report).    ergocalciferol (vitamin D2) (VITAMIN D PO) Take  by mouth.    escitalopram oxalate (LEXAPRO) 10 mg tablet Take one tablet by mouth daily.    ezetimibe (ZETIA) 10 mg tablet Take one tablet by mouth daily.    famotidine (PEPCID) 20 mg tablet Take one tablet by mouth daily.    glipiZIDE CR (GLUCOTROL XL) 10 mg tablet Take two tablets by mouth daily. Take 2 tabs by mouth daily    insulin glargine (LANTUS SOLOSTAR U-100 INSULIN) 100 unit/mL (3 mL) subcutaneous PEN Inject fifteen Units under the skin at bedtime daily.    levocetirizine 5 mg tab Take  one tablet by mouth at bedtime daily.    losartan-hydrochlorothiazide (HYZAAR) 100-25 mg tablet Take one tablet by mouth every morning.    magnesium oxide (MAG-OX) 400 mg (241.3 mg magnesium) tablet Take 500 mg by mouth daily.    montelukast (SINGULAIR) 10 mg tablet Take one tablet by mouth at bedtime daily.    pantoprazole DR (PROTONIX) 40 mg tablet Take one tablet by mouth daily.    potassium chloride SR (K-DUR) 10 mEq tablet Take one tablet by mouth daily. Take with a meal and a full glass of water.    topiramate (TOPAMAX) 100 mg tablet Take one tablet by mouth at bedtime daily.    vitamin E acetate (VITAMIN E PO) Take 180 mg by mouth four times weekly.     Objective:         BP 126/69 (BP Source: Arm, Right Upper, Patient Position: Sitting)  - Pulse 72  - Temp 36.7 ?C (98 ?F) (Oral)  - Resp 16  - Ht 165.1 cm (5' 5)  - Wt 87.8 kg (193 lb 9.6 oz)  - SpO2 97%  - BMI 32.22 kg/m?     Physical Exam:  Vitals reviewed.   Constitutional: Well-developed, well-nourished, in no apparent distress.    HEENT: Normocephalic. No noted scleral icterus.   GI:  Abdomen soft, non-distended, non-tender. BS present. No shifting dullness.   Skin:  Skin is warm and dry. No rash noted.  No jaundice. No noted spider nevi noted.  No presence of palmar erythema.  Peripheral Vascular: No noted lower extremity edema.  Musculoskeletal:  ROM intact, No presence of muscle wasting.   Psychiatric: Normal mood and affect. Behavior is normal.  Neuro:  No noted asterixis, No presence of tremor.    A total of 50 minutes was spent in the following activities: Preparing to see the patient, obtaining and/or reviewing separately obtained history, performing a medically appropriate examination and/or evaluation, counseling and educating the patient/family/caregiver, ordering medications/laboratory tests/procedures, referring and communication with other healthcare professionals, documenting clinical information in the electronic or other health record, and independently interpreting and communicating results (not separately reported) to the patient/family/caregiver.    Patient/patient family reports that all questions have been answered at this time. No additional pending concerns at conclusion of office visit.    The 21st Century Cures Act makes medical notes like these available to patients in the interest of transparency. However, be advised this is a medical document. It is intended as peer to peer communication. It is written in medical language and may contain abbreviations or verbiage that are unfamiliar. It may appear blunt or direct. Medical documents are intended to carry relevant information, facts as evident, and the clinical opinion of the practitioner.     If you have any further questions, please don't hesitate to contact our office. This note was created using Dragon, a Chemical engineer.  Please contact my office for any clarification of documentation.      Follow Up:   Return in about 1 year (around 08/05/2024) for Inell Mandril, APRN,In-Person, Gen Hep with Fibroscan.  _____________________________  Inell Mandril, APRN-C  Hepatology & Liver Transplantation  [facility]  Office: [phone]  Fax: [phone]

## 2023-08-06 ENCOUNTER — Ambulatory Visit: Admit: 2023-08-06 | Discharge: 2023-08-06 | Payer: PRIVATE HEALTH INSURANCE

## 2023-08-06 ENCOUNTER — Encounter: Admit: 2023-08-06 | Discharge: 2023-08-06 | Payer: PRIVATE HEALTH INSURANCE

## 2023-08-06 DIAGNOSIS — K760 Fatty (change of) liver, not elsewhere classified: Secondary | ICD-10-CM

## 2023-08-06 NOTE — Procedures
 Indication and Probe:  Indication:: NAFLD/NASH  Probe:: M-Probe    E (Elasticity) Median (kPa): 5.2  IQR / Median (%): 15  CAP Median (dB/m): 370    Interpretation  Fibrosis Interpretation: Other: F0-F1:  No to Minimal Fibrosis  Steatosis Score Correlation/Estimation: >290   S3: >67% Steatosis

## 2023-08-06 NOTE — Patient Instructions
 Reason for Visit  Metabolic Associated Fatty Liver Diease (MAFLD)  08/06/23    Patient Information/Education:  Consider talking to your primary care provider about getting a CGM- continuous glucose monitor.      Imaging  We did a Fibroscan of your liver today. It estimated how much fibrosis, or scarring, is on your liver. It also estimates the amount of fat on your liver.  Your results show evidence of steatosis, or fat, in the liver, but no scarring.    Medications  We recommend you avoid herbal supplements and over the counter herbal remedies due to potential hepatic toxicities.      Steatosis Management   Steatosis is when fat deposits in the liver. This can lead to inflammation, and chronic inflammation can ultimately lead to scarring and cirrhosis.   Try to lose 5-10% body weight in the next 6-12 months to help reduce fat in the liver.  Work with your primary care provided to keep your blood cholesterol, blood sugar, and blood pressure under control.  We recommend 30-45 minutes of exercise each day.   1-2 cups of black coffee per day can help. Any black tea or black coffee is okay.  We also recommend following a Mediterranean Diet.      Notify our office if you experience yellowing of the eyes or skin, dark amber urine, memory loss, confusion, swelling of the extremities or abdomen, or blood in the stool as these can all be complications from liver disease.     General Instructions:  How to reach us :   Please send a MyChart message to the Transplant Hepatology clinic or call us  at [phone]. Our office hours are 8am-4:30pm.  How to get a medication refill:  Please use the MyChart Refill request or contact your pharmacy directly to request medication refills. Please allow 48 hours.     How to receive your test results:  Results are released to your MyChart account immediately, please ensure you are utilizing this tool to review results after your clinic visit. Please allow time for your physician to review your results. If you don't have a MyChart account, you will receive a phone call or letter.  If you are expecting results and have not heard from our office within 10 business days of your testing, please call.  If you have labs, imaging, or admission at another facility please let us  know so we can request results.   Scheduling:  Our Scheduling phone number is [phone].    Support groups for many chronic illnesses are available through [facility]: [email or web address] or [phone].    [facility]  Dr. Nizar Talaat or Inell Mandril, APRN  Nurse Coordinator: Elmarie Hacking  [phone]    Check-out:  1. Return to clinic in 1  year  with Dr. Nizar Talaat In-person Gen Hep re.

## 2023-08-27 ENCOUNTER — Encounter: Admit: 2023-08-27 | Discharge: 2023-08-27 | Payer: PRIVATE HEALTH INSURANCE

## 2023-08-27 NOTE — Telephone Encounter
 08/27/2023 Records received with referral from [facility] have been indexed into the chart. EAK

## 2023-10-17 ENCOUNTER — Encounter: Admit: 2023-10-17 | Discharge: 2023-10-17 | Payer: PRIVATE HEALTH INSURANCE

## 2023-10-17 NOTE — Progress Notes
 CARDIAC NEW PATIENT PROFILE      PHYSICIANS INFORMATION:   REFERRING PHYSICIAN: Huntington, [place], [place]    PCP: Huntington, Melissa, PA    REASON FOR VISIT/DIAGNOSIS:   Hyperlipidemia  Hypertension    RECENT EVENTS/SYMPTOMS:     08/06/2023 HEPATOLOGY OV NOTE  HPI:  Gabrielle Andrade is a 63 y.o. female presenting alone today for continued management of MASLD.   Additional pertinent health history includes: HTN, DM, hyperlipidemia, fibromyalgia, irritable bowel syndrome, gastroparesis    Briefly, patient presented to hepatology clinic in 2018 for evaluation of hepatic steatosis. An abdominal CT for further evaluation of hematuria 10/2016 noting extensive hepatic steatosis, minimal splenic granulomatous disease, mild colonic diverticulosis, and atelectasis.  She subsequently had an ultrasound on October 29, 2016 which showed hepatic steatosis, hepatomegaly (liver measuring 18 cm), normal size spleen, calcified granuloma within the spleen, and common bile duct 5.7 mm.   Please refer to hepatology documentation noted on 06/01/2019 for further details.    Since seen in clinic last, patient reports  elevated liver enzymes and concerns about diabetes management.  Elevated liver enzyme levels were noted during a recent check-up with her PCP.  She notes a history of gastroparesis, which improved after discontinuing metformin. No yellowing of skin or eyes, nausea, vomiting, or stooling blood. Stomach issues have improved since stopping metformin.  She experiences fluctuations in blood sugar levels despite being on Lantus at a dose of 30 units. Blood sugar was 212 mg/dL in the morning and increased to 339 mg/dL after eating a breakfast bowl from [employer]. Her A1c decreased from 9.8% to 8.7% or 8.3% as of November. She is concerned about the difficulty in managing her blood sugar levels.   She has a history of elevated calcium scores and a recent heart evaluation showed some blockage. She has been denied insurance coverage for certain medications to manage cholesterol due to her primary care provider not being a cardiologist. She has a family history of heart disease, as her father has had similar issues. She is concerned about high cholesterol levels and finds it challenging to manage her diet due to conflicting dietary recommendations for her various health conditions.  She has been experiencing swelling in her legs and has gained 20 pounds. Potassium levels were low at 2.9 mmol/L, but improved to 3.4 mmol/L with supplementation. She has a history of thyroid issues diagnosed in 01/2023. She has been experiencing fluid retention.  She has worsening vision due to cataracts, with her left eye almost legally blind, and is planning for surgery.  She does not consume tobacco or alcohol regularly, only occasionally having a margarita.   She denies any additional hepatic decompensation events such as ascites, variceal hemorrhage, encephalopathy, or jaundice not noted above.  Today, the patient denies presence of abdominal pain, nausea, vomiting, bowel movement changes, overt blood loss (coffee grounds, hematemesis, hematochezia, melenic appearing stools), pruritus, day-night reversal, or episodes of confusion.  Does not have any noted limitation in mobility today.     ASSESSMENT AND PLAN:  It is recommended to lose 5% of body weight in order to reduce steatosis; 10% to reduce inflammation associated with NASH.   Weight loss may be achieved through a combination of diet (such as the mediterranean diet), in conjunction with exercise. Goal is to exercise >150 minutes week.  Consider liberalizing coffee and black tea consumption as data suggest this may slow progression of NASH-related fibrosis.  The highest mortality in NAFLD/NASH is from cardiovascular disease and careful attention should be  paid to associated risk factors, including management of HTN, HLP, DM2, OSA.   Statin use is safe and encouraged.  Previous use of GLP1-agonist;however, discontinued due to cost  (~$500/month). (Additional history noted to be positive for gastroparesis).  To discuss use of CGM with PCP to help further control DM.     08/04/2023 PCP OV NOTE  Gabrielle Andrade is a 63 year old female her today for a diabetic follow up. Last A1c was 8.5 done 02/10/2023. Today it was 9.8%. States she went to give herself her shot this morning and when she it out it was a little bent. States her surgars were 220 this morning and are now 330 so is wondering if she just did not get the dose in or if she did not get ti in the right spot. States her headaches have been bad and believes it may be her sugars. Did get her cardiac CT scoring done - score was 47. Currently taking lantus 30u bid, glipizide po, cannot afford any of the other diabetic medications. Has SE with metformin. Lipitor, crestor, and zocor had cough ana muscle aches. FH: father had 4 v cabg in his early 62's, sister has a pacemaker, son had MI at age 9. Current bmi 32% has gained about 15 lbs in the last year. Has not been able to afford sglt inhibitor, glp1, or a DPP-4.     A1c 8.5%11/24, fasting b/w 11/24 cr 1.10, ldl 120, coronary calcium score ct 08/02/23 47 needs lipids treated, b/w done today with LDL 137, mildly elevated LFT (ast 43, alt 55), intolerance to statins.    ASSESSMENT AND PLAN:  a1c today 9.8%   will try to get her approved for repatha    creatinine 1.03 today   LDL 137 today, this is on zetia   microalb <5.0 10/24   has appt with liver specialist at [facility] next week   will reseach if her insurance will cover another diabetic med in a cost effective manner as we HAVE   to get her bs down   f/u in 3 months, sooner if needed     PERTINENT CARDIAC HISTORY:   Hyperlipidemia  Hypertension  Edema  CVA (2015)    OTHER MEDICAL HISTORY:   Diabetes  Depression  Hypokalemia  Metabolic dysfunction- associated steatotic liver disease  Fibromyalgia  GERD    MOST RECENT PERTINENT TESTING/PROCEDURES:     08/02/2023 CT CARDIAC CALCIUM SCORING  FINDINGS:  Total calcification score calculated at 47.2.  Left main artery score: 35.4  Left anterior descending artery score: 11.9  Left circumflex artery score: 0  Right coronary artery score: 0  The scores demonstrate mild plaque burden and moderate cardiovascular disease risk.  The heart is normal in size.  Extracardiac findings: No significant abnormal extracardiac findings are identified.  IMPRESSION:  Coronary calcification score calculated at 47.2.     08/24/2019 ECHOCARDIOGRAM  CONCLUSION:  Normal left ventricular size. Normal left ventricular systolic function. LV Ejection Fraction was 66%. The ventricular ejection fraction was calculated using the biplane Simpson's rule method, Mild left ventricular hypertrophy. Left ventricular diastolic parameters were normal.     08/26/2016 STRESS ECHO  INTERPRETATION:  Rest Echo:   Overall left ventricular systolic function is normal. EF~ 55%  No regional wall motion abnormalities identified  Valves appear structurally normal without signficant stenosis or regurgitation  No significant pericardial effusion  Normal chamber dimensions  Normal LV wall thickness  Normal diastolic function  Normal aortic root dimensions  Estimated peak  systolic PA pressure =  23 mmHg     Stress ECG  Normal rest baseline ECG  Limited exercise capacity (4:34)  No symptoms of chest pain during exercise  Normal HR  response to exercise  Hypertension at rest and with stress  ST segments remain isoelectric during stress  No arrhythmias with stress  Normal stress ECG     Stress Echo:  EF improves with stress  LV function becomes hyperdynamic  LV volume decreases with stress.   No regional wall motion abnormalities post exercise.     Exercise echocardiogram negative for ischemia.  Low risk for cardiovascular complications    PERTINENT CARDIAC FAMILY HISTORY:   Father: CAD  Son: MI    LAB:           Component  Ref Range & Units (hover) 08/04/23 01/26/22 0740 09/17/21 0947 03/06/21 0815 06/10/20 0752   Cholesterol 200 217 High  R 205 High  R 222 High  R 211 High  R   Triglycerides 118 171 High  R 244 High  R 195 High  R 155 High  R   HDL 40 55 46 46 R 44   LDL 137 128 High  R 111 High  R 137 High  R 136 High  R   VLDL 24 34 49 High  R 39 R 31   Non HDL Cholesterol        Cholesterol/HDL Ratio 5 4 4 5 5    Resulting Agency OTHER LABDE LABDE LABDE LABDE        RECORDS: bookmarked    IMAGES:   No recent EKG available

## 2023-10-19 ENCOUNTER — Encounter: Admit: 2023-10-19 | Discharge: 2023-10-19 | Payer: PRIVATE HEALTH INSURANCE

## 2023-10-19 ENCOUNTER — Ambulatory Visit: Admit: 2023-10-19 | Discharge: 2023-10-19 | Payer: PRIVATE HEALTH INSURANCE

## 2023-10-19 DIAGNOSIS — R0609 Other forms of dyspnea: Secondary | ICD-10-CM

## 2023-10-19 DIAGNOSIS — E7849 Other hyperlipidemia: Secondary | ICD-10-CM

## 2023-10-19 DIAGNOSIS — Z136 Encounter for screening for cardiovascular disorders: Principal | ICD-10-CM

## 2023-10-19 DIAGNOSIS — I10 Essential (primary) hypertension: Secondary | ICD-10-CM

## 2023-10-19 MED ORDER — REPATHA SURECLICK 140 MG/ML SC PNIJ
140 mg | SUBCUTANEOUS | 3 refills | Status: CN
Start: 2023-10-19 — End: ?

## 2023-10-19 NOTE — Progress Notes
 Date of Service: 10/19/2023    Gabrielle Andrade is a 63 y.o. female.       HPI   Gabrielle Andrade presents with dyspnea with exertion.  This is a chronic symptom which worsened after she had COVID in 2021.  She can only walk a block and a half before stopping because of dyspnea.  Her dyspnea with exertion does not seem to have progressed over the past 4 years.  Gabrielle Andrade reports that she has had diabetes mellitus for 20 years but to her knowledge has no diabetic neuropathy, nephropathy or retinopathy.  She believes that she has been insulin requiring for the past 2 years.  She has recently been placed on Mounjaro and believes that she has lost 5 to 6 pounds over the last 2 months.  Mounjaro has also helped her diabetic control and she tends to run fasting blood sugars of 150 mg percent now instead of 200 mg percent.  Gabrielle Andrade has not been tolerant of statin therapy.  She reports developing a bad cough with 2 prior statins, 1 of which was atorvastatin.  She has been a non-smoker.  Gabrielle Andrade is retired and previously worked as an Airline pilot for the [employer] and also worked at [employer].  She retired in 2016.  Gabrielle Andrade reports that she is in the process of scheduling cataract surgery.  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  Otherwise, the patient has been doing well and reports no angina, congestive symptoms, palpitations, sensation of sustained forceful heart pounding, lightheadedness or syncope.  Her exercise tolerance has been stable.  She can carry out activities of daily living but does not have a regular exercise routine.  The patient reports no myalgias, claudication, bleeding abnormalities, or strokelike symptoms.       Vitals:    10/19/23 0851   BP: 139/88   BP Source: Arm, Left Upper   Pulse: 78   SpO2: 97%   O2 Device: None (Room air)   PainSc: Nine   Weight: 82.5 kg (181 lb 12.8 oz)   Height: 165.1 cm (5' 5)     Body mass index is 30.25 kg/m?SABRA     Past Medical History  Patient Active Problem List    Diagnosis Date Noted    Vitamin D  deficiency 06/17/2020    NASH (nonalcoholic steatohepatitis) 06/17/2020    Dietary counseling 06/17/2020    Primary hypertension 06/17/2020    Impaired glucose tolerance 06/17/2020    Other hyperlipidemia 06/17/2020         Review of Systems   Constitutional: Negative.   HENT: Negative.     Eyes: Negative.    Cardiovascular: Negative.    Respiratory: Negative.     Endocrine: Negative.    Hematologic/Lymphatic: Negative.    Skin: Negative.    Musculoskeletal: Negative.    Gastrointestinal: Negative.    Genitourinary: Negative.    Neurological: Negative.    Psychiatric/Behavioral: Negative.     Allergic/Immunologic: Negative.        Physical Exam  GENERAL: The patient is well developed, well nourished, resting comfortably and in no distress.   HEENT: No abnormalities of the visible oro-nasopharynx, conjunctiva or sclera are noted.  NECK: There is no jugular venous distension. Carotids are palpable and without bruits. There is no thyroid enlargement.  Chest: Lung fields are clear to auscultation. There are no wheezes or crackles.  CV: There is a regular rhythm. The first and  second heart sounds are normal. There are no murmurs, gallops or rubs.  ABD: The abdomen is soft and supple with normal bowel sounds. There is no hepatosplenomegaly, ascites, tenderness, masses or bruits.  Neuro: There are no focal motor defects. Ambulation is normal. Cognitive function appears normal.  Ext: There is no edema or evidence of deep vein thrombosis. Peripheral pulses are satisfactory.    SKIN: There are no rashes and no cellulitis  PSYCH: The patient is calm, rationale and oriented.    Cardiovascular Studies  A twelve-lead ECG obtained on 10/19/2023 reveals normal sinus rhythm with a heart rate of 69 bpm.  Nondiagnostic ST-T wave abnormalities are seen.  A coronary calcium score obtained on 08/02/2023 revealed left main: 35.4, left anterior descending colon 11.9, left circumflex: 0, right coronary artery: 0.  Total Agatston score = 47.2.  Cardiovascular Health Factors  Vitals BP Readings from Last 3 Encounters:   10/19/23 139/88   08/06/23 126/69   08/03/22 (!) 144/78     Wt Readings from Last 3 Encounters:   10/19/23 82.5 kg (181 lb 12.8 oz)   08/06/23 87.8 kg (193 lb 9.6 oz)   08/03/22 81.4 kg (179 lb 6.4 oz)     BMI Readings from Last 3 Encounters:   10/19/23 30.25 kg/m?   08/06/23 32.22 kg/m?   08/03/22 35.04 kg/m?      Smoking Social History     Tobacco Use   Smoking Status Never   Smokeless Tobacco Never      Lipid Profile Cholesterol   Date Value Ref Range Status   08/04/2023 200  Final     HDL   Date Value Ref Range Status   08/04/2023 40  Final     LDL   Date Value Ref Range Status   08/04/2023 137  Final     Triglycerides   Date Value Ref Range Status   08/04/2023 118  Final      Blood Sugar Hemoglobin A1C   Date Value Ref Range Status   08/04/2023 9.8  Final     Glucose   Date Value Ref Range Status   08/04/2023 234 (H) 70 - 105 mg/dL Final   92/89/7975 739 (H) 70 - 105 Final   01/26/2022 180 (H) 70 - 105 Final          Problems Addressed Today  Encounter Diagnoses   Name Primary?    Screening for heart disease Yes    Primary hypertension     Other hyperlipidemia     DOE (dyspnea on exertion)        Assessment and Plan   1) alternatives for treatment of hypercholesterolemia were reviewed with [name].  Since [name] has diabetes mellitus with elevated LDL cholesterol and does not tolerate statin therapy and has an abnormal coronary calcium score she wanted to add Repatha  to her lipid-lowering therapy.  I support this decision.  2) for further evaluation of her chronic dyspnea with exertion I have asked her to obtain an echo Doppler study to assess for any structural cardiac abnormalities as well as a regadenoson thallium stress test.  She has resting ECG abnormalities limiting the diagnostic capability of a treadmill stress test.  In addition, I do not think she would be able to walk on a treadmill to obtain a satisfactory exercise heart rate.  I have asked that these studies be obtained within the next 2 weeks.  3) I have asked Ms. Carlucci to continue working with her primary  care physician for control of her hypertension and diabetes mellitus.  I have asked her to return for follow-up to general cardiology clinic within the next 6 months to follow her progress.  Cardiovascular risk factor management was reviewed in detail.  The total time spent during this interview and exam with preparation and chart review was 60 minutes.         Current Medications (including today's revisions)   albuterol (VENTOLIN HFA) 90 mcg/actuation inhaler Inhale two puffs by mouth into the lungs every 6 hours as needed for Wheezing or Shortness of Breath. Shake well before use.    ascorbic acid (VITAMIN C PO) Take 1,000 mg by mouth four times weekly. emergency vitamin C drink    calcium carbonate/vitamin D -3 (OSCAL-500+D) 1250 mg/200 unit tablet Take one tablet by mouth daily. Calcium Carb 1250mg  delivers 500mg  elemental Ca    carisoprodoL (SOMA) 350 mg tablet Take one tablet by mouth daily.    Cinnamon Bark (CINNAMON) 500 mg cap Take  by mouth.    CoQ10 (Ubiquinol) 200 mg cap Take one capsule by mouth four times weekly.    cyclobenzaprine HCl (FLEXERIL PO) Take 10 mg by mouth nightly as needed (2 nights per week per patient report). (Patient not taking: Reported on 10/19/2023)    ergocalciferol  (vitamin D2) (VITAMIN D  PO) Take  by mouth.    escitalopram oxalate (LEXAPRO) 10 mg tablet Take one tablet by mouth daily.    evolocumab  (REPATHA  SURECLICK) 140 mg/mL injectable PEN Inject 1 mL under the skin every 14 days.    ezetimibe (ZETIA) 10 mg tablet Take one tablet by mouth daily.    famotidine (PEPCID) 20 mg tablet Take one tablet by mouth daily.    glipiZIDE CR (GLUCOTROL XL) 10 mg tablet Take two tablets by mouth daily. Take 2 tabs by mouth daily    insulin glargine (LANTUS SOLOSTAR U-100 INSULIN) 100 unit/mL (3 mL) subcutaneous PEN Inject fifteen Units under the skin at bedtime daily.    levocetirizine 5 mg tab Take one tablet by mouth at bedtime daily.    losartan-hydrochlorothiazide (HYZAAR) 100-25 mg tablet Take one tablet by mouth every morning.    magnesium oxide (MAG-OX) 400 mg (241.3 mg magnesium) tablet Take 500 mg by mouth daily.    montelukast (SINGULAIR) 10 mg tablet Take one tablet by mouth at bedtime daily.    MOUNJARO 2.5 mg/0.5 mL injector PEN Inject 0.5 mL under the skin every 7 days.    pantoprazole DR (PROTONIX) 40 mg tablet Take one tablet by mouth daily.    potassium chloride SR (K-DUR) 10 mEq tablet Take one tablet by mouth daily. Take with a meal and a full glass of water.    topiramate (TOPAMAX) 100 mg tablet Take one tablet by mouth at bedtime daily.    vitamin E acetate (VITAMIN E PO) Take 180 mg by mouth four times weekly.

## 2023-10-19 NOTE — Patient Instructions
 Thank you for visiting our office today.    We would like to make the following medication adjustments:      Repatha  injection every 14 days     **FASTING labs in 3 months**      Otherwise continue the same medications as you have been doing.          We will be pursuing the following tests after your appointment today:       Orders Placed This Encounter    LIPID PROFILE    ALT (SGPT)    ECG 12-LEAD    2D + DOPPLER ECHO    evolocumab  (REPATHA  SURECLICK) 140 mg/mL injectable PEN    REGADENOSON MPI STRESS TEST         We will plan to see you back in 6 months.  Please call us  in the meantime with any questions or concerns.        Please allow 5-7 business days for our providers to review your results. All normal results will go to MyChart. If you do not have Mychart, it is strongly recommended to get this so you can easily view all your results. If you do not have mychart, we will attempt to call you once with normal lab and testing results. If we cannot reach you by phone with normal results, we will send you a letter.  If you have not heard the results of your testing after one week please give us  a call.       Your Cardiovascular Medicine [place] Team [place], Olam Pierce, [place], and [place])  phone number is [phone].            [facility]    Nuclear Stress Test Instructions    Your cardiologist has asked that you have a nuclear stress test (also known as a Myocardial Perfusion Imaging (MPI) test.    This evaluation of your heart muscle consists of two sets of nuclear images and either a Treadmill stress test or a chemical stress test, decided by you and your physician.    You will get an IV placed in your arm for the test.    You will need to be able to raise your arm up by your head for about 20 minutes and lie on your back for about 10 minutes.  Please discuss this with your doctor or talk with the nuclear technologist or nurse if these are a problem for you.    It is recommended not to schedule any other appointment for the same day.      Wear comfortable clothing and walking shoes if you are walking on the treadmill.  Women should wear shorts or comfortable pants instead of dresses.   Sweatshirts or T-shirts work really well for imaging.    If you have a Zio Patch cardiac monitor in place, please reschedule your nuclear stress test after your monitoring period is complete. If you would like to proceed with the nuclear stress test during the monitoring time, the patch will have to be removed and the data will be lost; we can place a new patch following testing.       If you have a cardiac monitor with replacement patches, please inform the nurse prior to your appointment.  The monitor will need to be removed during testing. Please bring replacement patches with you so that we can resume monitoring following your test.     There may be enough time to leave to get  a snack after the stress portion of the test.   The Technologist will tell you what time to return for the second set of images.    PLEASE NO CAFFEINE 24 HOURS PRIOR TO TEST:  Examples include coffee, tea, decaffeinated drinks, colas, [facility], Dr. Nunzio.  Some orange sodas and root beers have caffeine, please check.  No energy drinks, Excedrin, Midol, or any foods CONTAINING CHOCOLATE.   Consuming Caffeine may postpone your test.    PLEASE DO NOT EAT OR DRINK THE MORNING OF YOUR TEST.  Water is ok to drink with your morning medications.    Please hold these medications the day of test:      DIABETIC PATIENTS:  if insulin dependent:  please take one third of your insulin with two pieces of dry toast and a small juice.  Bring remaining 2/3   insulin and oral diabetic medications with you to your test.    PLEASE NO TOBACCO PRODUCTS BETWEEN SCANS  You will NOT need a driver for this test.  But are welcome to bring a visitor with you.   Visitors will not be able to accompany you back to stress room.   Please do not bring children to the nuclear stress test.    TEST FINDINGS:   You will receive the results of the test within 7 business days of completion of this test by telephone. If you have any questions concerning your nuclear stress test or if you do not hear from your cardiologist/or nurse with 7 business days, please call our office.

## 2023-10-21 ENCOUNTER — Encounter: Admit: 2023-10-21 | Discharge: 2023-10-21 | Payer: PRIVATE HEALTH INSURANCE

## 2023-10-21 NOTE — Progress Notes
 Pharmacy Benefits Investigation    Medication name: evolocumab  (REPATHA  SURECLICK) 140 mg/mL injectable PEN  Medication status: new    The insurance requires a prior authorization for the medication. The prior authorization was submitted via CoverMyMeds.    PA number: [id number]    Gabrielle Andrade  Specialty Pharmacy Patient Advocate

## 2023-10-26 ENCOUNTER — Encounter: Admit: 2023-10-26 | Discharge: 2023-10-26 | Payer: PRIVATE HEALTH INSURANCE

## 2023-10-26 MED FILL — REPATHA SURECLICK 140 MG/ML SC PNIJ: 140 mg/mL | SUBCUTANEOUS | 28 days supply | Qty: 2 | Fill #0 | Status: CP

## 2023-10-26 NOTE — Progress Notes
 Pharmacy Benefits Investigation    Medication name: evolocumab  (REPATHA  SURECLICK) 140 mg/mL injectable PEN  Medication status: new    The prior authorization was approved for [employer] (PA number [id number]) from 10/21/2023 through 10/20/2024.    The out of pocket cost today is $24.99. This cost may change due to factors including but not limited to changes in insurance coverage.    A copay card was obtained and will provide the patient    After assistance, the final out of pocket cost is $15.    Contacted [employer] to discuss the approval and copay. Left voicemail asking patient to return call to the specialty pharmacy billing team at [phone]. Will follow up in 2 business days if patient has not returned call.    Monico Oris  Specialty Pharmacy Patient Advocate

## 2023-10-31 ENCOUNTER — Encounter: Admit: 2023-10-31 | Discharge: 2023-10-31 | Payer: PRIVATE HEALTH INSURANCE

## 2023-11-08 ENCOUNTER — Encounter: Admit: 2023-11-08 | Discharge: 2023-11-08 | Payer: PRIVATE HEALTH INSURANCE

## 2023-11-08 NOTE — Telephone Encounter
 Patient understood instructions. NPO after midnight. No caffeine 24 hours prior to test. Hold lantus and OTC vitamins, minerals, and supplements. Sent instructions to patient MyChart.

## 2023-11-09 ENCOUNTER — Encounter: Admit: 2023-11-09 | Discharge: 2023-11-09 | Payer: PRIVATE HEALTH INSURANCE

## 2023-11-09 ENCOUNTER — Ambulatory Visit: Admit: 2023-11-09 | Discharge: 2023-11-09 | Payer: PRIVATE HEALTH INSURANCE

## 2023-11-09 NOTE — Telephone Encounter
 Results and recommendations called to patient.

## 2023-11-09 NOTE — Telephone Encounter
-----   Message from GORMAN Alexander, MD sent at 11/09/2023 12:26 PM CDT -----  Favorable echo Doppler study.  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  I can explain to her what that means when she returns for follow-up.  Please let her know.    Thanks.  SBG  ----- Message -----  From: Charlanne Francois, MD  Sent: 11/09/2023  11:35 AM CDT  To: Elspeth KATHEE Alexander, MD

## 2023-11-12 ENCOUNTER — Encounter: Admit: 2023-11-12 | Discharge: 2023-11-12 | Payer: PRIVATE HEALTH INSURANCE

## 2023-11-15 ENCOUNTER — Encounter: Admit: 2023-11-15 | Discharge: 2023-11-15 | Payer: PRIVATE HEALTH INSURANCE

## 2023-11-16 ENCOUNTER — Encounter: Admit: 2023-11-16 | Discharge: 2023-11-16 | Payer: PRIVATE HEALTH INSURANCE

## 2023-11-17 MED FILL — REPATHA SURECLICK 140 MG/ML SC PNIJ: 140 mg/mL | SUBCUTANEOUS | 28 days supply | Qty: 2 | Fill #0 | Status: AC

## 2023-11-20 ENCOUNTER — Encounter: Admit: 2023-11-20 | Discharge: 2023-11-20 | Payer: PRIVATE HEALTH INSURANCE

## 2023-11-21 ENCOUNTER — Encounter: Admit: 2023-11-21 | Discharge: 2023-11-21 | Payer: PRIVATE HEALTH INSURANCE

## 2023-11-23 ENCOUNTER — Encounter: Admit: 2023-11-23 | Discharge: 2023-11-23 | Payer: PRIVATE HEALTH INSURANCE

## 2023-11-24 ENCOUNTER — Encounter: Admit: 2023-11-24 | Discharge: 2023-11-24 | Payer: PRIVATE HEALTH INSURANCE

## 2023-11-25 ENCOUNTER — Encounter: Admit: 2023-11-25 | Discharge: 2023-11-25 | Payer: PRIVATE HEALTH INSURANCE

## 2023-11-26 MED FILL — MOUNJARO 5 MG/0.5 ML SC PNIJ: 5 mg/0. mL | SUBCUTANEOUS | 28 days supply | Qty: 2 | Fill #0 | Status: AC

## 2023-12-10 ENCOUNTER — Encounter: Admit: 2023-12-10 | Discharge: 2023-12-10 | Payer: PRIVATE HEALTH INSURANCE

## 2023-12-20 ENCOUNTER — Encounter: Admit: 2023-12-20 | Discharge: 2023-12-20 | Payer: PRIVATE HEALTH INSURANCE

## 2023-12-21 ENCOUNTER — Encounter: Admit: 2023-12-21 | Discharge: 2023-12-21 | Payer: PRIVATE HEALTH INSURANCE

## 2023-12-21 MED FILL — REPATHA SURECLICK 140 MG/ML SC PNIJ: 140 mg/mL | SUBCUTANEOUS | 28 days supply | Qty: 2 | Fill #1 | Status: AC

## 2024-01-12 ENCOUNTER — Encounter: Admit: 2024-01-12 | Discharge: 2024-01-12 | Payer: PRIVATE HEALTH INSURANCE

## 2024-01-13 ENCOUNTER — Encounter: Admit: 2024-01-13 | Discharge: 2024-01-13 | Payer: PRIVATE HEALTH INSURANCE

## 2024-01-14 MED FILL — REPATHA SURECLICK 140 MG/ML SC PNIJ: 140 mg/mL | SUBCUTANEOUS | 28 days supply | Qty: 2 | Fill #2 | Status: AC

## 2024-02-02 ENCOUNTER — Encounter: Admit: 2024-02-02 | Discharge: 2024-02-02 | Payer: PRIVATE HEALTH INSURANCE

## 2024-02-02 MED ORDER — REPATHA SURECLICK 140 MG/ML SC PNIJ
140 mg | SUBCUTANEOUS | 11 refills | 28.00000 days | Status: AC
Start: 2024-02-02 — End: ?

## 2024-02-04 ENCOUNTER — Encounter: Admit: 2024-02-04 | Discharge: 2024-02-04 | Payer: PRIVATE HEALTH INSURANCE

## 2024-02-04 DIAGNOSIS — R0609 Other forms of dyspnea: Secondary | ICD-10-CM

## 2024-02-04 DIAGNOSIS — Z136 Encounter for screening for cardiovascular disorders: Principal | ICD-10-CM

## 2024-02-04 DIAGNOSIS — I10 Essential (primary) hypertension: Secondary | ICD-10-CM

## 2024-02-04 DIAGNOSIS — E7849 Other hyperlipidemia: Secondary | ICD-10-CM

## 2024-02-04 LAB — LIPID PROFILE
CHOLESTEROL/HDL %: 3
CHOLESTEROL: 137
HDL: 54
LDL: 58
TRIGLYCERIDES: 164 — ABNORMAL HIGH (ref 30–150)
VLDL: 33 — ABNORMAL HIGH (ref 2–30)

## 2024-02-04 LAB — ALT (SGPT): ALT: 29

## 2024-04-24 ENCOUNTER — Encounter: Admit: 2024-04-24 | Discharge: 2024-04-24 | Payer: PRIVATE HEALTH INSURANCE

## 2024-04-25 ENCOUNTER — Encounter: Admit: 2024-04-25 | Discharge: 2024-04-25 | Payer: PRIVATE HEALTH INSURANCE

## 2024-04-25 DIAGNOSIS — E7849 Other hyperlipidemia: Secondary | ICD-10-CM

## 2024-04-25 DIAGNOSIS — R0609 Other forms of dyspnea: Secondary | ICD-10-CM

## 2024-04-25 DIAGNOSIS — I10 Essential (primary) hypertension: Principal | ICD-10-CM

## 2024-04-25 NOTE — Patient Instructions [37]
 Thank you for visiting our office today.    We would like to make the following medication adjustments:  NONE       Otherwise continue the same medications as you have been doing.          We will be pursuing the following tests after your appointment today:            We will plan to see you back in 12 months.  Please call us in the meantime with any questions or concerns.        Please allow 5-7 business days for our providers to review your results. All normal results will go to MyChart. If you do not have Mychart, it is strongly recommended to get this so you can easily view all your results. If you do not have mychart, we will attempt to call you once with normal lab and testing results. If we cannot reach you by phone with normal results, we will send you a letter.  If you have not heard the results of your testing after one week please give [name] a call.       Your Cardiovascular Medicine [place] Team Brett Canales, Pilar Jarvis, Shawna [place], and [place])  phone number is [phone].
